# Patient Record
Sex: Female | Born: 1980 | Race: White | Hispanic: No | Marital: Married | Smoking: Never smoker
Health system: Southern US, Community
[De-identification: ages and names within clinical notes are randomized; demographics above are authoritative.]

## PROBLEM LIST (undated history)

## (undated) HISTORY — PX: ANTERIOR CRUCIATE LIGAMENT REPAIR: SHX115

## (undated) HISTORY — PX: TONSILLECTOMY: SUR1361

---

## 2011-08-29 ENCOUNTER — Ambulatory Visit: Payer: Self-pay | Admitting: Family Medicine

## 2011-09-22 ENCOUNTER — Ambulatory Visit: Payer: Self-pay | Admitting: Family Medicine

## 2011-10-04 ENCOUNTER — Ambulatory Visit: Payer: Self-pay | Admitting: Family Medicine

## 2011-10-12 ENCOUNTER — Ambulatory Visit (INDEPENDENT_AMBULATORY_CARE_PROVIDER_SITE_OTHER): Payer: BC Managed Care – PPO | Admitting: Family Medicine

## 2011-10-12 ENCOUNTER — Encounter: Payer: Self-pay | Admitting: Family Medicine

## 2011-10-12 DIAGNOSIS — Z136 Encounter for screening for cardiovascular disorders: Secondary | ICD-10-CM

## 2011-10-12 DIAGNOSIS — Z0000 Encounter for general adult medical examination without abnormal findings: Secondary | ICD-10-CM

## 2011-10-12 HISTORY — DX: Encounter for general adult medical examination without abnormal findings: Z00.00

## 2011-10-12 LAB — BASIC METABOLIC PANEL
BUN: 10 mg/dL (ref 6–23)
Calcium: 9.3 mg/dL (ref 8.4–10.5)
Chloride: 106 mEq/L (ref 96–112)
Creatinine, Ser: 0.9 mg/dL (ref 0.4–1.2)
GFR: 82.23 mL/min (ref >60.00)

## 2011-10-12 LAB — LIPID PANEL
Cholesterol: 156 mg/dL (ref 0–200)
LDL Cholesterol: 79 mg/dL (ref 0–99)
Triglycerides: 146 mg/dL (ref 0.0–149.0)

## 2011-10-12 NOTE — Progress Notes (Signed)
  Subjective:    Patient ID: Chelsea Frederick, female    DOB: 1981-01-20, 31 y.o.   MRN: [id number]  HPI    Review of Systems     Objective:   Physical Exam        Assessment & Plan:   Subjective:     Chelsea Frederick is a 31 y.o. female and is here to establish care.  The patient reports no problems.  History   Social History  . Marital Status: Married    Spouse Name: N/A    Number of Children: 1  . 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 Smoking status: Never Smoker    The PMH, PSH, Social History, Family History, Medications, and allergies have been reviewed in [facility], and have been updated if relevant.   Review of Systems Patient reports no  vision/ hearing changes,anorexia, weight change, fever ,adenopathy, persistant / recurrent hoarseness, swallowing issues, chest pain, edema,persistant / recurrent cough, hemoptysis, dyspnea(rest, exertional, paroxysmal nocturnal), gastrointestinal  bleeding (melena, rectal bleeding), abdominal pain, excessive heart burn, GU symptoms(dysuria, hematuria, pyuria, voiding/incontinence  Issues) syncope, focal weakness, severe memory loss, concerning skin lesions, depression, anxiety, abnormal bruising/bleeding, major joint swelling, breast masses or abnormal vaginal bleeding.     Objective:  BP 104/60  Pulse 68  Temp(Src) 98.3 F (36.8 C) (Oral)  Ht 5' 8.75" (1.746 m)  Wt 142 lb (64.411 kg)  BMI 21.12 kg/m2  General:  Well-developed,well-nourished,in no acute distress; alert,appropriate and cooperative throughout examination Head:  normocephalic and atraumatic.   Eyes:  vision grossly intact, pupils equal, pupils round, and pupils reactive to light.   Ears:  R ear normal and L ear normal.   Nose:  no external deformity.   Mouth:  good dentition.   Neck:  No deformities, masses, or tenderness noted. Lungs:  Normal respiratory effort, chest  expands symmetrically. Lungs are clear to auscultation, no crackles or wheezes. Heart:  Normal rate and regular rhythm. S1 and S2 normal without gallop, murmur, click, rub or other extra sounds. Abdomen:  Bowel sounds positive,abdomen soft and non-tender without masses, organomegaly or hernias noted. Msk:  No deformity or scoliosis noted of thoracic or lumbar spine.   Extremities:  No clubbing, cyanosis, edema, or deformity noted with normal full range of motion of all joints.   Neurologic:  alert & oriented X3 and gait normal.   Skin:  Intact without suspicious lesions or rashes Psych:  Cognition and judgment appear intact. Alert and cooperative with normal attention span and concentration. No apparent delusions, illusions, hallucinations   Assessment:    Healthy female exam.      Plan:  Reviewed preventive care protocols, scheduled due services, and updated immunizations Discussed nutrition, exercise, diet, and healthy lifestyle.    Orders Placed This Encounter  Procedures  . Basic Metabolic Panel  . Lipid Panel    See After Visit Summary for Counseling Recommendations

## 2011-10-12 NOTE — Patient Instructions (Signed)
Great to meet you. We will call you with your lab results. 

## 2011-11-06 ENCOUNTER — Ambulatory Visit (INDEPENDENT_AMBULATORY_CARE_PROVIDER_SITE_OTHER): Payer: BC Managed Care – PPO | Admitting: Family Medicine

## 2011-11-06 ENCOUNTER — Encounter: Payer: Self-pay | Admitting: Family Medicine

## 2011-11-06 VITALS — BP 102/60 | HR 60 | Temp 97.9°F | Wt 139.0 lb

## 2011-11-06 DIAGNOSIS — M25569 Pain in unspecified knee: Secondary | ICD-10-CM

## 2011-11-06 DIAGNOSIS — M25562 Pain in left knee: Secondary | ICD-10-CM

## 2011-11-06 NOTE — Patient Instructions (Signed)
Great to see you. Still go easy on knee. We will call you with your xray results tomorrow.

## 2011-11-06 NOTE — Progress Notes (Signed)
SUBJECTIVE: Chelsea Frederick is a 31 y.o. female who sustained a left knee injury 1 week(s) ago. Mechanism of injury: landing on ground after spiking a volleyball. Immediate symptoms: immediate pain, no deformity was noted by the patient. She was very nervous because 11 years ago, she tore her left ACL and had it repaired. Once she got home, she was stretching and felt a "pop" but no pain afterwards.  Knee has been intermittently mildly swollen at end of day and knee feels a little "full behind it" but not painful.  Patient Active Problem List  Diagnoses  . Routine general medical examination at a health care facility   No past medical history on file. Past Surgical History  Procedure Date  . Anterior cruciate ligament repair   . Tonsillectomy    History  Substance Use Topics  . Smoking status: Never Smoker   . Smokeless tobacco: Not on file  . Alcohol Use: Not on file   Family History  Problem Relation Age of Onset  . COPD Mother   . Heart disease Father 68    multiple MIs   No Known Allergies Current Outpatient Prescriptions on File Prior to Visit  Medication Sig Dispense Refill  . Multiple Vitamin (MULTIVITAMIN) tablet Take 1 tablet by mouth daily.      . Probiotic Product (PROBIOTIC FORMULA PO) Take by mouth daily.       The PMH, PSH, Social History, Family History, Medications, and allergies have been reviewed in [facility], and have been updated if relevant.   OBJECTIVE: BP 102/60  Pulse 60  Temp(Src) 97.9 F (36.6 C) (Oral)  Wt 139 lb (63.05 kg)  Vital signs as noted above. Appearance: alert, well appearing, and in no distress. Knee exam: normal exam, no swelling, tenderness, instability; ligaments intact, FROM small palpable baker's cyst X-ray: ordered, but results not yet available.  ASSESSMENT: Knee :baker's cyst  PLAN: rest the injured area as much as practical Xrays See orders for this visit as documented in the electronic medical record.

## 2011-11-07 ENCOUNTER — Telehealth: Payer: Self-pay | Admitting: Family Medicine

## 2011-11-07 ENCOUNTER — Ambulatory Visit (INDEPENDENT_AMBULATORY_CARE_PROVIDER_SITE_OTHER)
Admission: RE | Admit: 2011-11-07 | Discharge: 2011-11-07 | Disposition: A | Discharge status: Home / Self Care | Payer: BC Managed Care – PPO | Source: Ambulatory Visit | Attending: Family Medicine | Admitting: Family Medicine

## 2011-11-07 DIAGNOSIS — M25562 Pain in left knee: Secondary | ICD-10-CM

## 2011-11-07 DIAGNOSIS — M25569 Pain in unspecified knee: Secondary | ICD-10-CM

## 2011-11-07 NOTE — Telephone Encounter (Signed)
Please see result note 

## 2011-11-07 NOTE — Telephone Encounter (Signed)
Patient has been advised of results.  

## 2011-11-07 NOTE — Telephone Encounter (Signed)
Caller calling for test results.

## 2011-11-09 ENCOUNTER — Ambulatory Visit: Payer: BC Managed Care – PPO | Admitting: Family Medicine

## 2011-11-09 ENCOUNTER — Telehealth: Payer: Self-pay

## 2011-11-09 NOTE — Telephone Encounter (Signed)
Advised patient

## 2011-11-09 NOTE — Telephone Encounter (Signed)
I think it is ok to return to volleyball but listen to her body- take it slow. Ice knee after exercises and make sure she is stretching and cool down after exercise.

## 2011-11-09 NOTE — Telephone Encounter (Signed)
Left message asking pt to call back. 

## 2011-11-09 NOTE — Telephone Encounter (Signed)
Pt wants to know Dr Dellie Catholic advice on when OK to return to volleyball and begin exercising. Pain in knee is gone but still has feeling of fullness left knee. If pharmacy needed pt uses [facility]. Pt can be reached [phone].

## 2013-02-13 ENCOUNTER — Ambulatory Visit (INDEPENDENT_AMBULATORY_CARE_PROVIDER_SITE_OTHER): Payer: BC Managed Care – PPO | Admitting: Family Medicine

## 2013-02-13 ENCOUNTER — Encounter: Payer: Self-pay | Admitting: Family Medicine

## 2013-02-13 VITALS — BP 100/64 | HR 75 | Temp 98.2°F | Wt 136.5 lb

## 2013-02-13 DIAGNOSIS — R21 Rash and other nonspecific skin eruption: Secondary | ICD-10-CM

## 2013-02-13 NOTE — Assessment & Plan Note (Signed)
Highly likely not tick related.  Likely local irritation that is resolving from some other incidental insect bite/sting.  Would follow clinically. If other sx, then notify the clinic.  Murmur and nausea likely from pregnancy, as expected.  D/w pt.  She agrees.

## 2013-02-13 NOTE — Patient Instructions (Addendum)
I wouldn't do anything now other than monitor the rash.  It should slowly resolve.  If you have a fever, spreading rash, or other concerns, then let us know.

## 2013-02-13 NOTE — Progress Notes (Signed)
Pregnant, ~8 weeks.  Hasn't seen OB clinic yet.  No VB, no CTX, no LOF.    Tick bite earlier this summer.  She removed it, it wasn't engorged.  It was a few weeks ago, she wasn't sure about the site since the site was well healed.    Recently noted rash on back of L knee.  Itched a lot.  It is less red and less itchy today.  Central epithelial disruption.  No FCVD. She has morning sickness but this is typical for her, similar to prev.  No meds or tx yet for the area.    Meds, vitals, and allergies reviewed.   ROS: See HPI.  Otherwise, noncontributory.  nad ncat Mmm, no oral lesion Neck supple, no LA rrr Murmur noted as expected with pregnancy.  ctab abd soft, not ttp 2.5 x 2.5 cm faint pink macular rash on L popliteal area with central small epithelial disruption w/o FB noted.

## 2013-12-24 ENCOUNTER — Telehealth: Payer: Self-pay | Admitting: Family Medicine

## 2013-12-24 NOTE — Telephone Encounter (Signed)
Pt called and would like referral to orthopaedic for lower back pain.  Offered appointments since have not seen in over a year, pt declined, just wanted referral.

## 2013-12-24 NOTE — Telephone Encounter (Signed)
We cannot place this type of referral without seeing her first.  She could also see Dr. Patsy Lageropland here who is a wonderful sports medicine specialist- she would not have to see me first to see him.

## 2013-12-25 NOTE — Telephone Encounter (Signed)
Left message on phone that Dr. Dayton MartesAron recommends that she see Dr. Patsy Lageropland in our office for evaluation of back pain.

## 2013-12-26 NOTE — Telephone Encounter (Signed)
Mr Ealy said pt's pain level is 7 with back pain right now; Mr Minckler said he is not asking for referral but wants the name of a orthopedic back specialist that Dr Dayton Martes would recommend. Tried to explain that pt has not been seen by PCP in over one year and pt needs to be evaluated for an opinion of who pt should be referred to. Once again Mr Dobias said he does not want a referral he wants the name of a back specialist. Pt has been seeing a chiropractor. Mr Mcculley request cb ASAP.

## 2013-12-26 NOTE — Telephone Encounter (Signed)
Shirlee Limerick, do you know who may be a good "back specialist"

## 2013-12-26 NOTE — Telephone Encounter (Signed)
Dr Chelsea Frederick at [facility] is a back specialist. But any Ortho office would see her and do the workup.

## 2013-12-26 NOTE — Telephone Encounter (Signed)
See Marion's suggestion

## 2013-12-26 NOTE — Telephone Encounter (Signed)
Spoke to pts husband and advised per Shirlee Limerick. States that he will contact chiropractor, who recently treated pt to see if he can place referral. If not, pt will keep appt with SCopland 01/01/14.

## 2013-12-30 ENCOUNTER — Ambulatory Visit: Payer: BC Managed Care – PPO | Admitting: Family Medicine

## 2014-01-01 ENCOUNTER — Institutional Professional Consult (permissible substitution): Payer: BC Managed Care – PPO | Admitting: Family Medicine

## 2014-03-13 ENCOUNTER — Ambulatory Visit (INDEPENDENT_AMBULATORY_CARE_PROVIDER_SITE_OTHER): Payer: BC Managed Care – PPO | Admitting: Internal Medicine

## 2014-03-13 ENCOUNTER — Encounter: Payer: Self-pay | Admitting: Internal Medicine

## 2014-03-13 VITALS — BP 104/60 | HR 84 | Temp 99.8°F | Wt 138.8 lb

## 2014-03-13 DIAGNOSIS — R197 Diarrhea, unspecified: Secondary | ICD-10-CM

## 2014-03-13 DIAGNOSIS — R110 Nausea: Secondary | ICD-10-CM

## 2014-03-13 DIAGNOSIS — K921 Melena: Secondary | ICD-10-CM

## 2014-03-13 NOTE — Progress Notes (Signed)
Pre visit review using our clinic review tool, if applicable. No additional management support is needed unless otherwise documented below in the visit note. 

## 2014-03-13 NOTE — Progress Notes (Signed)
Subjective:    Patient ID: Chelsea Frederick, female    DOB: 07-21-1981, 33 y.o.   MRN: [id number]  HPI  Pt reports to the clinic today with c/o fever, chills, body aches, nausea and diarrhea. She reports this started last night.  She has noticed that there is blood in her diarrhea. She is able to keep fluids down. She does have some associated abdominal cramping. She has tried [employer], Tylenol and Imodium. She has not had sick contacts that she is aware of. She has not been on antibiotics recently. She has not traveled to a foreign country or drank contaminated water.  Review of Systems      No past medical history on file.  Current Outpatient Prescriptions  Medication Sig Dispense Refill  . Docosahexaenoic Acid (DHA PO) Take 1 tablet by mouth daily.      . Prenatal Vit-Fe Fumarate-FA (MULTIVITAMIN-PRENATAL) 27-0.8 MG TABS Take 1 tablet by mouth daily at 12 noon.       No current facility-administered medications for this visit.    No Known Allergies  Family History  Problem Relation Age of Onset  . COPD Mother   . Heart disease Father 44    multiple MIs    History   Social History  . Marital Status: Married    Spouse Name: N/A    Number of Children: 1  . 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 Smoking status: Never Smoker   . Smokeless tobacco: Not on file  . Alcohol Use: Not on file  . Drug Use: Not on file  . Sexual Activity: Not on file   Other Topics Concern  . Not on file   Social History Narrative  . No narrative on file     Constitutional: Pt reports fever. Denies malaise, fatigue, headache or abrupt weight changes.  Respiratory: Denies difficulty breathing, shortness of breath, cough or sputum production.   Cardiovascular: Denies chest pain, chest tightness, palpitations or swelling in the hands or feet.  Gastrointestinal: Pt reports nausea and  diarrhea, blood in stool. Denies abdominal pain, bloating, constipation.  GU: Denies urgency, frequency, pain with urination, burning sensation, blood in urine, odor or discharge.  No other specific complaints in a complete review of systems (except as listed in HPI above).  Objective:   Physical Exam   BP 104/60  Pulse 84  Temp(Src) 99.8 F (37.7 C) (Oral)  Wt 138 lb 12 oz (62.937 kg) Wt Readings from Last 3 Encounters:  03/13/14 138 lb 12 oz (62.937 kg)  02/13/13 136 lb 8 oz (61.916 kg)  11/06/11 139 lb (63.05 kg)    General: Appears her stated age, ill appearing in NAD. Skin: Warm, dry and intact.  Cardiovascular: Normal rate and rhythm. S1,S2 noted.  No murmur, rubs or gallops noted. No JVD or BLE edema. No carotid bruits noted. Pulmonary/Chest: Normal effort and positive vesicular breath sounds. No respiratory distress. No wheezes, rales or ronchi noted.  Abdomen: Soft and nontender. Normal bowel sounds, no bruits noted. No distention or masses noted. Liver, spleen and kidneys non palpable. Rectal: Normal anatomy. No external hemorrhoids. Good rectal tone. No internal hemorrhoids or fissure noted. + hemoccult.  BMET    Component Value Date/Time   NA 140 10/12/2011 1501   K 4.1 10/12/2011 1501   CL 106 10/12/2011 1501   CO2 27 10/12/2011 1501  GLUCOSE 84 10/12/2011 1501   BUN 10 10/12/2011 1501   CREATININE 0.9 10/12/2011 1501   CALCIUM 9.3 10/12/2011 1501    Lipid Panel     Component Value Date/Time   CHOL 156 10/12/2011 1501   TRIG 146.0 10/12/2011 1501   HDL 47.90 10/12/2011 1501   CHOLHDL 3 10/12/2011 1501   VLDL 29.2 10/12/2011 1501   LDLCALC 79 10/12/2011 1501          Assessment & Plan:   Viral diarrhea:  Continue the Imodium x 24 hours Drink plenty of fluids to avoid dehydration She declines RX for zofran Wash hands thoroughly after going to restroom  If worse over the weekend or more blood in stool, go to ER If not worse but persist, follow up with PCP on Monday

## 2014-03-13 NOTE — Patient Instructions (Addendum)

## 2014-03-16 ENCOUNTER — Encounter: Payer: Self-pay | Admitting: Internal Medicine

## 2014-03-16 ENCOUNTER — Ambulatory Visit (INDEPENDENT_AMBULATORY_CARE_PROVIDER_SITE_OTHER): Payer: BC Managed Care – PPO | Admitting: Internal Medicine

## 2014-03-16 VITALS — BP 94/62 | HR 91 | Temp 98.1°F | Wt 140.0 lb

## 2014-03-16 DIAGNOSIS — K921 Melena: Secondary | ICD-10-CM

## 2014-03-16 DIAGNOSIS — R197 Diarrhea, unspecified: Secondary | ICD-10-CM

## 2014-03-16 DIAGNOSIS — R110 Nausea: Secondary | ICD-10-CM

## 2014-03-16 LAB — COMPREHENSIVE METABOLIC PANEL
ALBUMIN: 3.7 g/dL (ref 3.5–5.2)
ALK PHOS: 55 U/L (ref 39–117)
ALT: 15 U/L (ref 0–35)
AST: 20 U/L (ref 0–37)
BUN: 9 mg/dL (ref 6–23)
CO2: 28 mEq/L (ref 19–32)
Calcium: 8.9 mg/dL (ref 8.4–10.5)
Chloride: 102 mEq/L (ref 96–112)
Creatinine, Ser: 0.9 mg/dL (ref 0.4–1.2)
GFR: 76.84 mL/min (ref >60.00)
GLUCOSE: 76 mg/dL (ref 70–99)
POTASSIUM: 3.8 meq/L (ref 3.5–5.1)
Sodium: 135 mEq/L (ref 135–145)
TOTAL PROTEIN: 6.8 g/dL (ref 6.0–8.3)
Total Bilirubin: 0.3 mg/dL (ref 0.2–1.2)

## 2014-03-16 LAB — CBC WITH DIFFERENTIAL/PLATELET
Basophils Absolute: 0 10*3/uL (ref 0.0–0.1)
Basophils Relative: 0.4 % (ref 0.0–3.0)
EOS PCT: 0.6 % (ref 0.0–5.0)
Eosinophils Absolute: 0 10*3/uL (ref 0.0–0.7)
HCT: 35.3 % — ABNORMAL LOW (ref 36.0–46.0)
Hemoglobin: 12.2 g/dL (ref 12.0–15.0)
Lymphocytes Relative: 34 % (ref 12.0–46.0)
Lymphs Abs: 1.6 10*3/uL (ref 0.7–4.0)
MCHC: 34.6 g/dL (ref 30.0–36.0)
MCV: 89.5 fl (ref 78.0–100.0)
MONO ABS: 0.8 10*3/uL (ref 0.1–1.0)
NEUTROS PCT: 46.7 % (ref 43.0–77.0)
Neutro Abs: 2.2 10*3/uL (ref 1.4–7.7)
PLATELETS: 146 10*3/uL — AB (ref 150.0–400.0)
RBC: 3.94 Mil/uL (ref 3.87–5.11)
RDW: 11.9 % (ref 11.5–15.5)
WBC: 4.6 10*3/uL (ref 4.0–10.5)

## 2014-03-16 NOTE — Progress Notes (Signed)
Subjective:    Patient ID: Chelsea Frederick, female    DOB: 07/27/1981, 33 y.o.   MRN: [id number]  HPI  Pt presents to the clinic today with c/o continued nausea and diarrhea. She reports there was no improvement with the Imodium. She is also starting to have sharp abdominal pains, fever and chills. She has been taking the tylenol and imodium for the fevers.  Review of Systems      No past medical history on file.  Current Outpatient Prescriptions  Medication Sig Dispense Refill  . Docosahexaenoic Acid (DHA PO) Take 1 tablet by mouth daily.      . Prenatal Vit-Fe Fumarate-FA (MULTIVITAMIN-PRENATAL) 27-0.8 MG TABS Take 1 tablet by mouth daily at 12 noon.       No current facility-administered medications for this visit.    No Known Allergies  Family History  Problem Relation Age of Onset  . COPD Mother   . Heart disease Father 78    multiple MIs    History   Social History  . Marital Status: Married    Spouse Name: N/A    Number of Children: 1  . 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 Smoking status: Never Smoker   . Smokeless tobacco: Not on file  . Alcohol Use: Not on file  . Drug Use: Not on file  . Sexual Activity: Not on file   Other Topics Concern  . Not on file   Social History Narrative  . No narrative on file     Constitutional: Denies fever, malaise, fatigue, headache or abrupt weight changes.  HEENT: Denies eye pain, eye redness, ear pain, ringing in the ears, wax buildup, runny nose, nasal congestion, bloody nose, or sore throat. Respiratory: Denies difficulty breathing, shortness of breath, cough or sputum production.   Cardiovascular: Denies chest pain, chest tightness, palpitations or swelling in the hands or feet.  Gastrointestinal: Pt reports nausea, diarrhea and blood in stool. Denies bloating, constipation.    No other specific complaints  in a complete review of systems (except as listed in HPI above).   Objective:   Physical Exam   BP 94/62  Pulse 91  Temp(Src) 98.1 F (36.7 C) (Oral)  Wt 140 lb (63.504 kg)  SpO2 98% Wt Readings from Last 3 Encounters:  03/16/14 140 lb (63.504 kg)  03/13/14 138 lb 12 oz (62.937 kg)  02/13/13 136 lb 8 oz (61.916 kg)    General: Appears her stated age, well developed, well nourished in NAD. Cardiovascular: Normal rate and rhythm. S1,S2 noted.  No murmur, rubs or gallops noted. No JVD or BLE edema. No carotid bruits noted. Pulmonary/Chest: Normal effort and positive vesicular breath sounds. No respiratory distress. No wheezes, rales or ronchi noted.  Abdomen: Soft and nontender. No pain at mcburney's point. Normal bowel sounds, no bruits noted. No distention or masses noted. Liver, spleen and kidneys non palpable. :  BMET    Component Value Date/Time   NA 140 10/12/2011 1501   K 4.1 10/12/2011 1501   CL 106 10/12/2011 1501   CO2 27 10/12/2011 1501   GLUCOSE 84 10/12/2011 1501   BUN 10 10/12/2011 1501   CREATININE 0.9 10/12/2011 1501   CALCIUM 9.3 10/12/2011 1501    Lipid Panel     Component Value Date/Time   CHOL 156 10/12/2011 1501   TRIG 146.0 10/12/2011 1501  HDL 47.90 10/12/2011 1501   CHOLHDL 3 10/12/2011 1501   VLDL 29.2 10/12/2011 1501   LDLCALC 79 10/12/2011 1501    CBC No results found for this basename: wbc, rbc, hgb, hct, plt, mcv, mch, mchc, rdw, neutrabs, lymphsabs, monoabs, eosabs, basosabs    Hgb A1C No results found for this basename: HGBA1C        Assessment & Plan:   Diarrhea, fever, chills, blood in stool:  Will check CBC and CMET May need CT Scan abd but will wait until labs are back Stool studies ordered  Will follow up after blood work is back

## 2014-03-16 NOTE — Progress Notes (Signed)
Pre visit review using our clinic review tool, if applicable. No additional management support is needed unless otherwise documented below in the visit note. 

## 2014-03-16 NOTE — Patient Instructions (Addendum)
Bloody Diarrhea °Bloody diarrhea can be caused by many different conditions. Most of the time bloody diarrhea is the result of food poisoning or minor infections. Bloody diarrhea usually improves over 2 to 3 days of rest and fluid replacement. Other conditions that can cause bloody diarrhea include: °· Internal bleeding. °· Infection. °· Diseases of the bowel and colon. °Internal bleeding from an ulcer or bowel disease can be severe and requires hospital care or even surgery. °DIAGNOSIS  °To find out what is wrong your caregiver may check your: °· Stool. °· Blood. °· Results from a test that looks inside the body (endoscopy). °TREATMENT  °· Get plenty of rest. °· Drink enough water and fluids to keep your urine clear or pale yellow. °· Do not smoke. °· Solid foods and dairy products should be avoided until your illness improves. °· As you improve, slowly return to a regular diet with easily-digested foods first. Examples are: °¨ Bananas. °¨ Rice. °¨ Toast. °¨ Crackers. °You should only need these for about 2 days before adding more normal foods to your diet. °· Avoid spicy or fatty foods as well as caffeine and alcohol for several days. °· Medicine to control cramping and diarrhea can relieve symptoms but may prolong some cases of bloody diarrhea. Antibiotics can speed recovery from diarrhea due to some bacterial infections. Call your caregiver if diarrhea does not get better in 3 days. °SEEK MEDICAL CARE IF:  °· You do not improve after 3 days. °· Your diarrhea improves but your stool appears black. °SEEK IMMEDIATE MEDICAL CARE IF:  °· You become extremely weak or faint. °· You become very sweaty. °· You have increased pain or bleeding. °· You develop repeated vomiting. °· You vomit and you see blood or the vomit looks black in color. °· You have a fever. °Document Released: 07/24/2005 Document Revised: 10/16/2011 Document Reviewed: 06/25/2009 °ExitCare® Patient Information ©2015 [employer]. This information is  not intended to replace advice given to you by your health care provider. Make sure you discuss any questions you have with your health care provider. ° °

## 2014-03-16 NOTE — Addendum Note (Signed)
Addended by: Liane ComberHAVERS, Sanchez Hemmer C on: 03/16/2014 11:53 AM   Modules accepted: Orders

## 2014-03-17 ENCOUNTER — Other Ambulatory Visit: Payer: Self-pay | Admitting: Internal Medicine

## 2014-03-17 DIAGNOSIS — R509 Fever, unspecified: Secondary | ICD-10-CM

## 2014-03-17 DIAGNOSIS — R109 Unspecified abdominal pain: Secondary | ICD-10-CM

## 2014-03-17 DIAGNOSIS — R197 Diarrhea, unspecified: Secondary | ICD-10-CM

## 2014-03-18 LAB — C. DIFFICILE GDH AND TOXIN A/B
C. DIFF TOXIN A/B: NOT DETECTED
C. difficile GDH: NOT DETECTED

## 2014-03-19 LAB — OVA AND PARASITE EXAMINATION: OP: NONE SEEN

## 2014-03-23 ENCOUNTER — Telehealth: Payer: Self-pay | Admitting: *Deleted

## 2014-03-23 NOTE — Telephone Encounter (Signed)
Lab called with stool culture results.  Salmonella was detected. Results not released in Epic yet, but will be soon.

## 2014-03-23 NOTE — Telephone Encounter (Signed)
Pt notified, no longer having symptoms.

## 2014-06-08 ENCOUNTER — Encounter: Payer: Self-pay | Admitting: Internal Medicine

## 2014-06-18 LAB — STOOL CULTURE

## 2014-12-22 ENCOUNTER — Ambulatory Visit (INDEPENDENT_AMBULATORY_CARE_PROVIDER_SITE_OTHER): Payer: BLUE CROSS/BLUE SHIELD | Admitting: Family Medicine

## 2014-12-22 ENCOUNTER — Ambulatory Visit (INDEPENDENT_AMBULATORY_CARE_PROVIDER_SITE_OTHER)
Admission: RE | Admit: 2014-12-22 | Discharge: 2014-12-22 | Disposition: A | Discharge status: Home / Self Care | Payer: BLUE CROSS/BLUE SHIELD | Source: Ambulatory Visit | Attending: Family Medicine | Admitting: Family Medicine

## 2014-12-22 ENCOUNTER — Encounter: Payer: Self-pay | Admitting: Family Medicine

## 2014-12-22 ENCOUNTER — Telehealth: Payer: Self-pay | Admitting: Family Medicine

## 2014-12-22 VITALS — BP 114/62 | HR 63 | Temp 98.1°F | Wt 132.2 lb

## 2014-12-22 DIAGNOSIS — R42 Dizziness and giddiness: Secondary | ICD-10-CM

## 2014-12-22 DIAGNOSIS — M542 Cervicalgia: Secondary | ICD-10-CM | POA: Diagnosis not present

## 2014-12-22 MED ORDER — CYCLOBENZAPRINE HCL 5 MG PO TABS: 5.0000 mg | ORAL_TABLET | Freq: Three times a day (TID) | ORAL | Status: DC | PRN

## 2014-12-22 MED ORDER — DEXAMETHASONE SODIUM PHOSPHATE 10 MG/ML IJ SOLN
10.0000 mg | Freq: Once | INTRAMUSCULAR | Status: AC
  Administered 2014-12-22: 10 mg via INTRAMUSCULAR

## 2014-12-22 NOTE — Patient Instructions (Signed)
Please keep me updated and if anything worsens, go immediately to the ER.

## 2014-12-22 NOTE — Telephone Encounter (Signed)
She stated her xray was normal What should she do next she is taking muscles relaxer Should she put cold or hot compress on it Should she stay still or move around more She been in bed most of day How long should she wait out the pain she is going out of town on Thursday   Pt would like a call back today

## 2014-12-22 NOTE — Progress Notes (Signed)
Pre visit review using our clinic review tool, if applicable. No additional management support is needed unless otherwise documented below in the visit note. 

## 2014-12-22 NOTE — Assessment & Plan Note (Signed)
New- complicated presentation but as I explained to pt and her husband, there are reassuring signs on exam and in history. She is not experiencing any upper extremity weakness or radiculopathy and has normal neuro exam.  Also less likely to be cardiac since she is experiencing limited range of motion of her neck.  ? Muscle spasm with nerve impingement and acute onset of severe pain caused nausea and syncope.  Discussed going to the ER versus starting work up here and if symptoms worsen, go to the ER. Pt and husband want to start work up here since symptoms are improving somewhat.  IM decadron given in office to reduce inflammation, eRx sent for flexeril.  Stat cervical xray, may need cervical MRI depending on progression of symptoms.  She will keep me closely updated.  The patient indicates understanding of these issues and agrees with the plan.

## 2014-12-22 NOTE — Progress Notes (Signed)
Subjective:   Patient ID: Chelsea Frederick, female    DOB: 1980/12/22, 34 y.o.   MRN: [id number]  Chelsea Frederick is a pleasant 34 y.o. year old female who presents to clinic today with Neck Pain; Nausea; and Headache  on 12/22/2014  HPI: Acute onset of neck pain at 2 am.  Was lifting her head in the middle of the night and immediately felt something in her neck "crack."  Immediate limited range of motion in her neck and because nauseated and then passed out.  Her husband caught her- she was out for "maybe a few seconds."  Still nauseated but has not vomited.  Neck pain is improving but still has limited ROM.  Husband thinks this is improving as well.  Now has a low grade headache but did not have one last night.  Has never had anything like this before but has vomited and passed out with other types of pain in past.  No other recent injuries.  No fevers, blurred vision or photophobia.  No current outpatient prescriptions on file prior to visit.   No current facility-administered medications on file prior to visit.    No Known Allergies  No past medical history on file.  Past Surgical History  Procedure Laterality Date  . Anterior cruciate ligament repair    . Tonsillectomy      Family History  Problem Relation Age of Onset  . COPD Mother   . Heart disease Father [id number]    multiple MIs    History   Social History  . Marital Status: Married    Spouse Name: N/A  . Number of Children: 1  . 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 Smoking status: Never Smoker   . Smokeless tobacco: Not on file  . Alcohol Use: Not on file  . Drug Use: Not on file  . Sexual Activity: Not on file   Other Topics Concern  . Not on file   Social History Narrative   The PMH, PSH, Social History, Family History, Medications, and allergies [facility], and have been updated if  relevant.   Review of Systems  Constitutional: Negative.   Eyes: Negative.   Respiratory: Negative.   Cardiovascular: Negative.   Gastrointestinal: Positive for nausea. Negative for vomiting.  Endocrine: Negative.   Musculoskeletal: Positive for neck pain and neck stiffness. Negative for myalgias.  Skin: Negative.   Allergic/Immunologic: Negative.   Neurological: Positive for dizziness and headaches. Negative for tremors, seizures, syncope, facial asymmetry, speech difficulty, weakness, light-headedness and numbness.  Psychiatric/Behavioral: Negative.   All other systems reviewed and are negative.      Objective:    BP 114/62 mmHg  Pulse 63  Temp(Src) 98.1 F (36.7 C) (Oral)  Wt 132 lb 4 oz (59.988 kg)  SpO2 98%  LMP 12/12/2014   Physical Exam  Constitutional: She is oriented to person, place, and time. She appears well-developed and well-nourished. No distress.  HENT:  Head: Normocephalic.  Eyes: Conjunctivae are normal.  Neck: Muscular tenderness present. Carotid bruit is not present. No Brudzinski's sign and no Kernig's sign noted.  Limited ROM of neck,- pain with extension and flexion, does seem worse with extension. Also limited ROM with head turning from side to side  Cardiovascular: Normal rate.   Pulmonary/Chest: Effort normal.  Neurological: She is alert and oriented to person, place, and  time. She has normal reflexes. She displays normal reflexes. No cranial nerve deficit. Coordination normal.  Normal grip strength bilaterally  Skin: Skin is warm and dry.  Psychiatric: She has a normal mood and affect. Her behavior is normal. Judgment and thought content normal.  Nursing note and vitals reviewed.         Assessment & Plan:   Neck pain, acute - Plan: DG Cervical Spine Complete, CANCELED: DG Cervical Spine Complete  Dizziness and giddiness No Follow-up on file.

## 2014-12-23 NOTE — Telephone Encounter (Signed)
Called pt and spoke with her this morning. She is feeling much better- almost has full range of motion of her neck. Actually plans on going to work today and she will keep me updated.

## 2017-05-21 ENCOUNTER — Encounter: Payer: Self-pay | Admitting: Family Medicine

## 2017-05-21 ENCOUNTER — Ambulatory Visit (INDEPENDENT_AMBULATORY_CARE_PROVIDER_SITE_OTHER)
Admission: RE | Admit: 2017-05-21 | Discharge: 2017-05-21 | Disposition: A | Discharge status: Home / Self Care | Payer: BLUE CROSS/BLUE SHIELD | Source: Ambulatory Visit | Attending: Family Medicine | Admitting: Family Medicine

## 2017-05-21 ENCOUNTER — Ambulatory Visit (INDEPENDENT_AMBULATORY_CARE_PROVIDER_SITE_OTHER): Payer: BLUE CROSS/BLUE SHIELD | Admitting: Family Medicine

## 2017-05-21 VITALS — BP 106/68 | HR 68 | Temp 98.3°F | Ht 68.75 in | Wt 141.0 lb

## 2017-05-21 DIAGNOSIS — Z9889 Other specified postprocedural states: Secondary | ICD-10-CM

## 2017-05-21 DIAGNOSIS — M25562 Pain in left knee: Secondary | ICD-10-CM

## 2017-05-21 NOTE — Progress Notes (Signed)
SUBJECTIVE: Chelsea Frederick is a 36 y.o. female who sustained a left knee injury 1 1/2  week(s) ago. Mechanism of injury: unknown but started exercising one month ago. Immediate symptoms: immediate pain, delayed pain. Symptoms have been intermittent since that time. Prior history of related problems: no prior problems with this area in the past, previous surgery of ACL.  No current outpatient prescriptions on file prior to visit.   No current facility-administered medications on file prior to visit.     No Known Allergies  No past medical history on file.  Past Surgical History:  Procedure Laterality Date  . ANTERIOR CRUCIATE LIGAMENT REPAIR    . TONSILLECTOMY      Family History  Problem Relation Age of Onset  . COPD Mother   . Heart disease Father 91       multiple MIs    Social History   Social History  . Marital status: Married    Spouse name: N/A  . Number of children: 1  . Years of education: N/A   Occupational History  . 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 Smoking status: Never Smoker  . Smokeless tobacco: Not on file  . Alcohol use Not on file  . Drug use: Unknown  . Sexual activity: Not on file   Other Topics Concern  . Not on file   Social History Narrative  . No narrative on file   The PMH, PSH, Social History, Family History, Medications, and allergies have been reviewed in [facility], and have been updated if relevant.  OBJECTIVE:  BP 106/68 (BP Location: Left Arm, Patient Position: Sitting, Cuff Size: Normal)   Pulse 68   Temp 98.3 F (36.8 C) (Oral)   Ht 5' 8.75" (1.746 m)   Wt 141 lb (64 kg)   LMP 05/01/2017   SpO2 98%   BMI 20.97 kg/m   Vital signs as noted above. Appearance: alert, well appearing, and in no distress and oriented to person, place, and time. Knee exam: normal exam, no swelling, tenderness, instability; ligaments intact, FROM. X-ray: ordered, but results not  yet available.  ASSESSMENT: Knee internal derangement  PLAN: rest the injured area as much as practical, X-Ray ordered, referral to Orthopedics for this injury See orders for this visit as documented in the electronic medical record.

## 2017-05-23 NOTE — Addendum Note (Signed)
Addended by: Dianne DunARON, Jennaya Pogue M on: 05/23/2017 08:54 AM   Modules accepted: Orders

## 2017-05-24 ENCOUNTER — Encounter: Payer: Self-pay | Admitting: Family Medicine

## 2017-05-30 ENCOUNTER — Telehealth: Payer: Self-pay

## 2017-05-30 NOTE — Telephone Encounter (Signed)
I'm so sorry.  I thought I responded.  I do think she should see an orthopedist- the xray showed probable arthritis/changes due to surgery.  If she is still having symptoms, I do think an orthopedist will want to do an MRI and look at treatment options.

## 2017-05-30 NOTE — Telephone Encounter (Signed)
TA-Plz see pt email requesting for you to elaborate on X-Ray results/plz advise/thx dmf

## 2017-05-30 NOTE — Telephone Encounter (Signed)
Copied from CRM #[id number]. Topic: Quick Communication - See Telephone Encounter >> May 30, 2017 10:18 AM Guinevere FerrariMorris, Lukas Pelcher E, NT wrote: CRM for notification. See Telephone encounter for:  05/30/17. Pt. Called in and wanted someone to call her back about a recent X Ray.

## 2017-05-31 NOTE — Telephone Encounter (Signed)
Pt aware/she will call her ortho and sched appt/will advise if needs reports for them/thx dmf

## 2017-06-26 ENCOUNTER — Ambulatory Visit: Payer: BLUE CROSS/BLUE SHIELD | Admitting: Family Medicine

## 2017-06-26 ENCOUNTER — Encounter: Payer: Self-pay | Admitting: Family Medicine

## 2017-06-26 DIAGNOSIS — M25562 Pain in left knee: Secondary | ICD-10-CM | POA: Diagnosis not present

## 2017-06-26 NOTE — Patient Instructions (Signed)
Your pain is due to mild arthritis. These are the different medications you can take for this: Tylenol 500mg  1-2 tabs three times a day for pain. Capsaicin, aspercreme, or biofreeze topically up to four times a day may also help with pain. Some supplements that may help for arthritis: Boswellia extract, curcumin, pycnogenol Aleve 1-2 tabs twice a day with food as needed Cortisone injections are an option. If cortisone injections do not help, there are different types of shots that may help but they take longer to take effect (viscosupplementation). It's important that you continue to stay active. Straight leg raises, knee extensions 3 sets of 10 once a day (add ankle weight if these become too easy). Consider physical therapy to strengthen muscles around the joint that hurts to take pressure off of the joint itself - let me know if you want to do this. Shoe inserts with good arch support may be helpful. Heat or ice 15 minutes at a time 3-4 times a day as needed to help with pain. Water aerobics and cycling with low resistance are the best two types of exercise for arthritis though any exercise is ok as long as it doesn't worsen the pain. Follow up with me in 6 weeks or as needed.  I would consider an MRI of this knee if the catching, giving out becomes more frequent to characterize the possible loose body in your knee.

## 2017-06-30 ENCOUNTER — Encounter: Payer: Self-pay | Admitting: Family Medicine

## 2017-06-30 NOTE — Progress Notes (Signed)
PCP: Dianne DunAron, Talia M, MD  Subjective:   HPI: Patient is a 36 y.o. female here for left knee pain.  Patient reports for about 1 1/2 months she's had worsening left knee pain medially. Associated swelling and pain with any exercise. Tried sleeve, brace for support. Pain level currently 1/10 and a soreness. History of ACL tear with reconstruction back in 2002 with a hamstring graft. Did well after this surgery and returned to volleyball. Reports also having intermittent episodes of this knee giving out since 2012 - if she turns wrong feels something pop back into place when she extends the knee. Tylenol has helped. No skin changes, numbness, new acute injuries.  History reviewed. No pertinent past medical history.  No current outpatient medications on file prior to visit.   No current facility-administered medications on file prior to visit.     Past Surgical History:  Procedure Laterality Date  . ANTERIOR CRUCIATE LIGAMENT REPAIR    . TONSILLECTOMY      No Known Allergies  Social History   Socioeconomic History  . Marital status: Married    Spouse name: Not on file  . Number of children: 1  . Years of education: Not on file  . Highest education level: Not on file  Social Needs  . Financial resource strain: Not on file  . Food insecurity - worry: Not on file  . Food insecurity - inability: Not on file  . Transportation needs - medical: Not on file  . Transportation needs - non-medical: Not on file  Occupational History  . Occupation: Professor    Employer: [employer]    Comment: biochem- [employer]  Tobacco Use  . Smoking status: Never Smoker  . Smokeless tobacco: Never Used  Substance and Sexual Activity  . Alcohol use: No  . Drug use: No  . Sexual activity: Not on file  Other Topics Concern  . Not on file  Social History Narrative  . Not on file    Family History  Problem Relation Age of Onset  . COPD Mother   . Heart disease Father  3450       multiple MIs    BP 110/82   Pulse 70   Ht 5\' 9"  (1.753 m)   Wt 137 lb (62.1 kg)   BMI 20.23 kg/m   Review of Systems: See HPI above.     Objective:  Physical Exam:  Gen: NAD, comfortable in exam room  Left knee: No gross deformity, ecchymoses.  Mild effusion TTP medial joint line.  No other tenderness. FROM with 5/5 strength. Negative ant/post drawers. Negative valgus/varus testing. Negative lachmanns. Negative mcmurrays, apleys, patellar apprehension. NV intact distally.  Right knee: No gross deformity, ecchymoses, swelling. No TTP. FROM with full strength. Negative ant/post drawers. Negative valgus/varus testing. Negative lachmanns. NV intact distally.   Assessment & Plan:  1. Left knee pain - independently reviewed radiographs and no acute abnormalities.  She does have apparent loose body that is well corticated suggesting this is old.  Discussed with her I believe she has two separate problems.  Her current pain is consistent with arthritis (radiographs not weight bearing, has medial tenderness with normal meniscus testing, and is 16 years out from ACL tear when arthritis is common 10+ years out).  Second issue of the catching is likely from this loose body.  We discussed MRI to further confirm location and that this is intraarticular and not calcified portion of meniscus (unlikely as on lateral it's fairly superior  in the joint).  She would like to wait on MRI and treat the arthritis.  Discussed tylenol, topical medications, supplements, aleve.  Shown home exercises to do daily.  Consider cortisone injection, physical therapy (she would like to wait on either of these today).  Heat or ice.  F/u in 6 weeks.

## 2017-06-30 NOTE — Assessment & Plan Note (Signed)
independently reviewed radiographs and no acute abnormalities.  She does have apparent loose body that is well corticated suggesting this is old.  Discussed with her I believe she has two separate problems.  Her current pain is consistent with arthritis (radiographs not weight bearing, has medial tenderness with normal meniscus testing, and is 16 years out from ACL tear when arthritis is common 10+ years out).  Second issue of the catching is likely from this loose body.  We discussed MRI to further confirm location and that this is intraarticular and not calcified portion of meniscus (unlikely as on lateral it's fairly superior in the joint).  She would like to wait on MRI and treat the arthritis.  Discussed tylenol, topical medications, supplements, aleve.  Shown home exercises to do daily.  Consider cortisone injection, physical therapy (she would like to wait on either of these today).  Heat or ice.  F/u in 6 weeks.

## 2017-12-27 ENCOUNTER — Encounter: Payer: Self-pay | Admitting: Internal Medicine

## 2017-12-27 ENCOUNTER — Ambulatory Visit: Payer: BLUE CROSS/BLUE SHIELD | Admitting: Internal Medicine

## 2017-12-27 VITALS — BP 106/74 | HR 72 | Temp 97.9°F | Wt 142.0 lb

## 2017-12-27 DIAGNOSIS — J301 Allergic rhinitis due to pollen: Secondary | ICD-10-CM | POA: Diagnosis not present

## 2017-12-27 DIAGNOSIS — Z0184 Encounter for antibody response examination: Secondary | ICD-10-CM | POA: Diagnosis not present

## 2017-12-27 MED ORDER — PREDNISONE 10 MG PO TABS: ORAL_TABLET | ORAL | 0 refills | Status: DC

## 2017-12-27 NOTE — Progress Notes (Signed)
HPI  Pt presents to the clinic today with c/o headache, facial pain and pressure, ear pain, nasal congestion, sore throat and cough. She reports this started 6 weeks ago. The headache is located . She describes the ear pain as. The right seems worse than the lest. She denies decreased hearing or drainage from the ear. She is not blowing anything out of her nose. She denies difficulty swallowing. The cough is non productive. She denies fever, chills or body aches. She has tried Xyzal and Ibuprofen with some relief. She has no history of allergies. She has not had sick contacts.  She would also like a MMR titer drawn to check immunity status. She did have the vaccines as a child.  Review of Systems    No past medical history on file.  Family History  Problem Relation Age of Onset  . COPD Mother   . Heart disease Father 33       multiple MIs    Social History   Socioeconomic History  . Marital status: Married    Spouse name: Not on file  . Number of children: 1  . Years of education: Not on file  . Highest education level: Not on file  Occupational History  . Occupation: Professor    Fish farm manager: [employer]    Comment: biochem- [employer]  Social Needs  . Financial resource strain: Not on file  . Food insecurity:    Worry: Not on file    Inability: Not on file  . Transportation needs:    Medical: Not on file    Non-medical: Not on file  Tobacco Use  . Smoking status: Never Smoker  . Smokeless tobacco: Never Used  Substance and Sexual Activity  . Alcohol use: No  . Drug use: No  . Sexual activity: Not on file  Lifestyle  . Physical activity:    Days per week: Not on file    Minutes per session: Not on file  . Stress: Not on file  Relationships  . Social connections:    Talks on phone: Not on file    Gets together: Not on file    Attends religious service: Not on file    Active member of club or organization: Not on file    Attends meetings of clubs  or organizations: Not on file    Relationship status: Not on file  . Intimate partner violence:    Fear of current or ex partner: Not on file    Emotionally abused: Not on file    Physically abused: Not on file    Forced sexual activity: Not on file  Other Topics Concern  . Not on file  Social History Narrative  . Not on file    No Known Allergies   Constitutional: Positive headache. Denies fatigue, fever or abrupt weight changes.  HEENT:  Positive facial pain, ear pain, nasal congestion and sore throat. Denies eye redness, ringing in the ears, wax buildup, runny nose or bloody nose. Respiratory: Positive cough. Denies difficulty breathing or shortness of breath.  Cardiovascular: Denies chest pain, chest tightness, palpitations or swelling in the hands or feet.   No other specific complaints in a complete review of systems (except as listed in HPI above).  Objective:  BP 106/74   Pulse 72   Temp 97.9 F (36.6 C) (Oral)   Wt 142 lb (64.4 kg)   LMP 12/01/2017   SpO2 99%   BMI 20.97 kg/m   General: Appears her stated  age, well developed, well nourished in NAD. HEENT: Head: normal shape and size, no sinus tenderness noted; Ears: Tm's gray and intact, normal light reflex; Nose: mucosa pink and moist, septum midline; Throat/Mouth: + PND. Teeth present, mucosa erythematous and moist, no exudate noted, no lesions or ulcerations noted.  Neck:  Bilateral anterior cervical adenopathy noted.  Cardiovascular: Normal rate and rhythm. S1,S2 noted.  No murmur, rubs or gallops noted.  Pulmonary/Chest: Normal effort and positive vesicular breath sounds. No respiratory distress. No wheezes, rales or ronchi noted.       Assessment & Plan:   Allergic Rhinitis:  Can use a Neti Pot which can be purchased from your local drug store. Continue Xyzal Start Nasonex once done with Pred Taper eRx for Pred Taper x 6 days  Immunity status to MMR unknown:  MMR titer today  RTC as needed or if  symptoms persist. Webb Silversmith, NP

## 2017-12-27 NOTE — Patient Instructions (Signed)

## 2017-12-28 LAB — MEASLES/MUMPS/RUBELLA IMMUNITY
RUBELLA: 16.5 {index}
Rubeola IgG: <25 AU/mL — ABNORMAL LOW

## 2018-01-03 ENCOUNTER — Ambulatory Visit (INDEPENDENT_AMBULATORY_CARE_PROVIDER_SITE_OTHER): Payer: BLUE CROSS/BLUE SHIELD

## 2018-01-03 DIAGNOSIS — Z23 Encounter for immunization: Secondary | ICD-10-CM | POA: Diagnosis not present

## 2018-01-07 ENCOUNTER — Ambulatory Visit: Payer: Self-pay

## 2018-01-07 NOTE — Telephone Encounter (Signed)
Pt c/o sore throat, headache and intermittent ear ache. Pt was seen in office for similar sx and finished a steroid taper. 2 days after finishing the taper, she has redeveloped sx. Pt has been taking acetaminophen or ibuprofen for sore throat pain. Care advice given and [facility]   Reason for Disposition . Earache also present    Intermittent ear pain  Answer Assessment - Initial Assessment Questions 1. ONSET: "When did the throat start hurting?" (Hours or days ago)      01/03/18 2. SEVERITY: "How bad is the sore throat?" (Scale 1-10; mild, moderate or severe)   - MILD (1-3):  doesn't interfere with eating or normal activities   - MODERATE (4-7): interferes with eating some solids and normal activities   - SEVERE (8-10):  excruciating pain, interferes with most normal activities   - SEVERE DYSPHAGIA: can't swallow liquids, drooling     Moderate 5/10 3. STREP EXPOSURE: "Has there been any exposure to strep within the past week?" If so, ask: "What type of contact occurred?"      no 4.  VIRAL SYMPTOMS: "Are there any symptoms of a cold, such as a runny nose, cough, hoarse voice or red eyes?"      no 5. FEVER: "Do you have a fever?" If so, ask: "What is your temperature, how was it measured, and when did it start?"     No but feels feverish when the pain reliever wears starts sweats and chills  6. PUS ON THE TONSILS: "Is there pus on the tonsils in the back of your throat?"     no 7. OTHER SYMPTOMS: "Do you have any other symptoms?" (e.g., difficulty breathing, headache, rash)     Headache-  8. PREGNANCY: "Is there any chance you are pregnant?" "When was your last menstrual period?"     No LMP: 1 week ago  Protocols used: SORE THROAT-A-AH

## 2018-01-08 ENCOUNTER — Encounter: Payer: Self-pay | Admitting: Internal Medicine

## 2018-01-08 ENCOUNTER — Ambulatory Visit: Payer: No Typology Code available for payment source | Admitting: Internal Medicine

## 2018-01-08 VITALS — BP 108/70 | HR 91 | Temp 98.2°F | Wt 139.0 lb

## 2018-01-08 DIAGNOSIS — J069 Acute upper respiratory infection, unspecified: Secondary | ICD-10-CM | POA: Diagnosis not present

## 2018-01-08 DIAGNOSIS — J302 Other seasonal allergic rhinitis: Secondary | ICD-10-CM | POA: Diagnosis not present

## 2018-01-08 MED ORDER — AMOXICILLIN 875 MG PO TABS: 875.0000 mg | ORAL_TABLET | Freq: Two times a day (BID) | ORAL | 0 refills | Status: DC

## 2018-01-08 NOTE — Patient Instructions (Signed)
Upper Respiratory Infection, Adult Most upper respiratory infections (URIs) are caused by a virus. A URI affects the nose, throat, and upper air passages. The most common type of URI is often called "the common cold." Follow these instructions at home:  Take medicines only as told by your doctor.  Gargle warm saltwater or take cough drops to comfort your throat as told by your doctor.  Use a warm mist humidifier or inhale steam from a shower to increase air moisture. This may make it easier to breathe.  Drink enough fluid to keep your pee (urine) clear or pale yellow.  Eat soups and other clear broths.  Have a healthy diet.  Rest as needed.  Go back to work when your fever is gone or your doctor says it is okay. ? You may need to stay home longer to avoid giving your URI to others. ? You can also wear a face mask and wash your hands often to prevent spread of the virus.  Use your inhaler more if you have asthma.  Do not use any tobacco products, including cigarettes, chewing tobacco, or electronic cigarettes. If you need help quitting, ask your doctor. Contact a doctor if:  You are getting worse, not better.  Your symptoms are not helped by medicine.  You have chills.  You are getting more short of breath.  You have brown or red mucus.  You have yellow or brown discharge from your nose.  You have pain in your face, especially when you bend forward.  You have a fever.  You have puffy (swollen) neck glands.  You have pain while swallowing.  You have white areas in the back of your throat. Get help right away if:  You have very bad or constant: ? Headache. ? Ear pain. ? Pain in your forehead, behind your eyes, and over your cheekbones (sinus pain). ? Chest pain.  You have long-lasting (chronic) lung disease and any of the following: ? Wheezing. ? Long-lasting cough. ? Coughing up blood. ? A change in your usual mucus.  You have a stiff neck.  You have  changes in your: ? Vision. ? Hearing. ? Thinking. ? Mood. This information is not intended to replace advice given to you by your health care provider. Make sure you discuss any questions you have with your health care provider. Document Released: 01/10/2008 Document Revised: 03/26/2016 Document Reviewed: 10/29/2013 Elsevier Interactive Patient Education  2018 [employer]  

## 2018-01-08 NOTE — Progress Notes (Signed)
Subjective:    Patient ID: Chelsea Frederick, female    DOB: [birth year], 37 y.o.   MRN: [id number]  HPI  Pt presents to the clinic today with c/o headache, ear fullness, runny nose and sore throat. She reports this started 2 weeks ago. She was seen 5/23 for the same, diagnosed with allergies. She was treated with Prednisone Taper x 6 days. She reports she felt great while on Prednisone, but as soon as she finished it, symptoms returned. She describes the headache as pressure behind her eyes. She denies visual changes or dizziness. She is blowing clear mucous out of her nose. She denies difficulty swallowing. She denies fever, but reports she has felt warm. She ran out of Xyzal but did start taking Nasocort 4 days ago. She has not tried anything additional OTC.She has not had sick contacts.  Review of Systems  No past medical history on file.  Current Outpatient Medications  Medication Sig Dispense Refill  . ibuprofen (ADVIL,MOTRIN) 200 MG tablet Take 400 mg by mouth every 6 (six) hours as needed.    . triamcinolone (NASACORT) 55 MCG/ACT AERO nasal inhaler Place into the nose.    Marland Kitchen amoxicillin (AMOXIL) 875 MG tablet Take 1 tablet (875 mg total) by mouth 2 (two) times daily. 20 tablet 0  . levocetirizine (XYZAL) 2.5 MG/5ML solution Take 2.5 mg by mouth every evening.     No current facility-administered medications for this visit.     No Known Allergies  Family History  Problem Relation Age of Onset  . COPD Mother   . Heart disease Father 69       multiple MIs    Social History   Socioeconomic History  . Marital status: Married    Spouse name: Not on file  . Number of children: 1  . Years of education: Not on file  . Highest education level: Not on file  Occupational History  . 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 Financial resource strain: Not on file  . Food insecurity:    Worry: Not on  file    Inability: Not on file  . Transportation needs:    Medical: Not on file    Non-medical: Not on file  Tobacco Use  . Smoking status: Never Smoker  . Smokeless tobacco: Never Used  Substance and Sexual Activity  . Alcohol use: No  . Drug use: No  . Sexual activity: Not on file  Lifestyle  . Physical activity:    Days per week: Not on file    Minutes per session: Not on file  . Stress: Not on file  Relationships  . Social connections:    Talks on phone: Not on file    Gets together: Not on file    Attends religious service: Not on file    Active member of club or organization: Not on file    Attends meetings of clubs or organizations: Not on file    Relationship status: Not on file  . Intimate partner violence:    Fear of current or ex partner: Not on file    Emotionally abused: Not on file    Physically abused: Not on file    Forced sexual activity: Not on file  Other Topics Concern  . Not on file  Social History Narrative  . Not on file     Constitutional: Pt reports headache. Denies fever, malaise, fatigue, or  abrupt weight changes.  HEENT: Pt reports runny nose, ear fullness, sore throat. Denies eye pain, eye redness, ear pain, ringing in the ears, wax buildup, nasal congestion, bloody nose Respiratory: Denies difficulty breathing, shortness of breath, cough or sputum production.   Cardiovascular: Denies chest pain, chest tightness, palpitations or swelling in the hands or feet.    No other specific complaints in a complete review of systems (except as listed in HPI above).     Objective:   Physical Exam   BP 108/70   Pulse 91   Temp 98.2 F (36.8 C) (Oral)   Wt 139 lb (63 kg)   LMP 12/31/2017   SpO2 98%   BMI 20.53 kg/m  Wt Readings from Last 3 Encounters:  01/08/18 139 lb (63 kg)  12/27/17 142 lb (64.4 kg)  06/26/17 137 lb (62.1 kg)    General: Appears her stated age, in NAD. HEENT: Head: normal shape and size, no sinus tenderness noted;   Ears: Tm's gray and intact, normal light reflex; Nose: mucosa pink and moist, septum midline; Throat/Mouth: Teeth present, mucosa pink and moist, +PND,  no exudate, lesions or ulcerations noted.  Neck:   Bilateral anterior cervical adenopathy noted. Cardiovascular: Normal rate and rhythm.  Pulmonary/Chest: Normal effort and positive vesicular breath sounds. No respiratory distress. No wheezes, rales or ronchi noted.   BMET    Component Value Date/Time   NA 135 03/16/2014 1152   K 3.8 03/16/2014 1152   CL 102 03/16/2014 1152   CO2 28 03/16/2014 1152   GLUCOSE 76 03/16/2014 1152   BUN 9 03/16/2014 1152   CREATININE 0.9 03/16/2014 1152   CALCIUM 8.9 03/16/2014 1152    Lipid Panel     Component Value Date/Time   CHOL 156 10/12/2011 1501   TRIG 146.0 10/12/2011 1501   HDL 47.90 10/12/2011 1501   CHOLHDL 3 10/12/2011 1501   VLDL 29.2 10/12/2011 1501   LDLCALC 79 10/12/2011 1501    CBC    Component Value Date/Time   WBC 4.6 03/16/2014 1152   RBC 3.94 03/16/2014 1152   HGB 12.2 03/16/2014 1152   HCT 35.3 (L) 03/16/2014 1152   PLT 146.0 (L) 03/16/2014 1152   MCV 89.5 03/16/2014 1152   MCHC 34.6 03/16/2014 1152   RDW 11.9 03/16/2014 1152   LYMPHSABS 1.6 03/16/2014 1152   MONOABS 0.8 03/16/2014 1152   EOSABS 0.0 03/16/2014 1152   BASOSABS 0.0 03/16/2014 1152    Hgb A1C No results found for: HGBA1C         Assessment & Plan:   Upper Respiratory Infection:  Given duration of symptoms, will treat with Amoxil 875 mg BID x 10 days Advised her to restart the Xyzal Continue Nasocort If symptoms persist, consider referral to allergy  Return precautions discussed Nicki Reaperegina Jovon Streetman, NP

## 2018-01-16 ENCOUNTER — Ambulatory Visit (INDEPENDENT_AMBULATORY_CARE_PROVIDER_SITE_OTHER): Payer: No Typology Code available for payment source | Admitting: Family Medicine

## 2018-01-16 ENCOUNTER — Other Ambulatory Visit: Payer: Self-pay | Admitting: Family Medicine

## 2018-01-16 ENCOUNTER — Encounter: Payer: Self-pay | Admitting: Family Medicine

## 2018-01-16 VITALS — BP 98/70 | HR 92 | Temp 98.3°F | Ht 69.0 in | Wt 141.8 lb

## 2018-01-16 DIAGNOSIS — R509 Fever, unspecified: Secondary | ICD-10-CM

## 2018-01-16 DIAGNOSIS — R221 Localized swelling, mass and lump, neck: Secondary | ICD-10-CM | POA: Insufficient documentation

## 2018-01-16 DIAGNOSIS — J029 Acute pharyngitis, unspecified: Secondary | ICD-10-CM

## 2018-01-16 DIAGNOSIS — E0590 Thyrotoxicosis, unspecified without thyrotoxic crisis or storm: Secondary | ICD-10-CM

## 2018-01-16 HISTORY — DX: Localized swelling, mass and lump, neck: R22.1

## 2018-01-16 LAB — POCT RAPID STREP A (OFFICE): Rapid Strep A Screen: NEGATIVE

## 2018-01-16 LAB — COMPREHENSIVE METABOLIC PANEL
ALT: 13 U/L (ref 0–35)
AST: 15 U/L (ref 0–37)
Albumin: 3.9 g/dL (ref 3.5–5.2)
Alkaline Phosphatase: 69 U/L (ref 39–117)
BUN: 11 mg/dL (ref 6–23)
CHLORIDE: 104 meq/L (ref 96–112)
CO2: 29 meq/L (ref 19–32)
CREATININE: 0.6 mg/dL (ref 0.40–1.20)
Calcium: 9.9 mg/dL (ref 8.4–10.5)
GFR: 118.57 mL/min (ref >60.00)
Glucose, Bld: 98 mg/dL (ref 70–99)
POTASSIUM: 4.3 meq/L (ref 3.5–5.1)
SODIUM: 140 meq/L (ref 135–145)
Total Bilirubin: 0.4 mg/dL (ref 0.2–1.2)
Total Protein: 7.2 g/dL (ref 6.0–8.3)

## 2018-01-16 LAB — CBC WITH DIFFERENTIAL/PLATELET
BASOS ABS: 0 10*3/uL (ref 0.0–0.1)
Basophils Relative: 0.5 % (ref 0.0–3.0)
EOS ABS: 0.1 10*3/uL (ref 0.0–0.7)
Eosinophils Relative: 1.2 % (ref 0.0–5.0)
HCT: 34.6 % — ABNORMAL LOW (ref 36.0–46.0)
Hemoglobin: 11.6 g/dL — ABNORMAL LOW (ref 12.0–15.0)
LYMPHS ABS: 1.2 10*3/uL (ref 0.7–4.0)
Lymphocytes Relative: 19.8 % (ref 12.0–46.0)
MCHC: 33.6 g/dL (ref 30.0–36.0)
MCV: 88.4 fl (ref 78.0–100.0)
MONO ABS: 0.5 10*3/uL (ref 0.1–1.0)
Monocytes Relative: 7.9 % (ref 3.0–12.0)
NEUTROS ABS: 4.2 10*3/uL (ref 1.4–7.7)
NEUTROS PCT: 70.6 % (ref 43.0–77.0)
PLATELETS: 278 10*3/uL (ref 150.0–400.0)
RBC: 3.92 Mil/uL (ref 3.87–5.11)
RDW: 11.6 % (ref 11.5–15.5)
WBC: 6 10*3/uL (ref 4.0–10.5)

## 2018-01-16 LAB — T4, FREE: Free T4: 3.26 ng/dL — ABNORMAL HIGH (ref 0.60–1.60)

## 2018-01-16 LAB — TSH: TSH: <0.01 u[IU]/mL — ABNORMAL LOW (ref 0.35–4.50)

## 2018-01-16 NOTE — Assessment & Plan Note (Signed)
Complicated by recurrent sweats, low grade temp, achiness, swollen glands only improved with prednisone and NSAIDs. Agree with seeing allergist, continuing antihistamine and nasal spray. ? If other issue- ? Tick borne illness- did pull a lot of ticks off of family. Check labs today. Orders Placed This Encounter  Procedures  . CBC with Differential/Platelet  . Epstein-Barr virus nuclear antigen antibody, IgG  . TSH  . Comprehensive metabolic panel  . T4, free  . [place] spotted fvr abs pnl(IgG+IgM)  . B. burgdorfi antibodies  . POCT rapid strep A   Rapid strep neg.

## 2018-01-16 NOTE — Patient Instructions (Signed)
Great to see you. I will call you with your lab results from today and you can view them online.   

## 2018-01-16 NOTE — Progress Notes (Signed)
Subjective:   Patient ID: Chelsea Frederick, female    DOB: 06-28-1981, 37 y.o.   MRN: [id number]  Chelsea Frederick is a pleasant 37 y.o. year old female who presents to clinic today with Allergic Reaction (headache,sore throat,gland swelling,sweat/ saw Baity--prednison and amox didnt work)  on 01/16/2018  HPI:  Here for ? Allergic reaction.  Saw Nicki Reaperegina Baity on 12/27/17 and again on 01/08/18.  Notes reviewed.   Was seen on 5/23 for headache, ear fullness, runny nose and sore throat for  2 weeks duration. Diagnosed with allergies. She was treated with Prednisone Taper x 6 days. She reports she felt great while on Prednisone, but as soon as she finished it, symptoms returned. She returned with the same symptoms on 01/08/18. She describes the headache as pressure behind her eyes. She was diagnosed with URI and placed on Aomxicillin 875 mg twice daily x 10 days, advised to restart xyzal and continue nasocort. Also referred to an allergist- appointment at the end of July.  Currently taking xyzal and nasocort daily.  Symptoms have returned- feels glands and ears are always swollen. No mucous.  No runny nose. Feverish all the time.  Tired, achy.  Ibuprofen does help.  Current Outpatient Medications on File Prior to Visit  Medication Sig Dispense Refill  . amoxicillin (AMOXIL) 875 MG tablet Take 1 tablet (875 mg total) by mouth 2 (two) times daily. 20 tablet 0  . ibuprofen (ADVIL,MOTRIN) 200 MG tablet Take 400 mg by mouth every 6 (six) hours as needed.    Marland Kitchen. levocetirizine (XYZAL) 2.5 MG/5ML solution Take 2.5 mg by mouth every evening.    . triamcinolone (NASACORT) 55 MCG/ACT AERO nasal inhaler Place into the nose.     No current facility-administered medications on file prior to visit.     No Known Allergies  No past medical history on file.  Past Surgical History:  Procedure Laterality Date  . ANTERIOR CRUCIATE LIGAMENT REPAIR    . TONSILLECTOMY      Family History    Problem Relation Age of Onset  . COPD Mother   . Heart disease Father [id number]       multiple MIs    Social History   Socioeconomic History  . Marital status: Married    Spouse name: Not on file  . Number of children: 1  . Years of education: Not on file  . Highest education level: Not on file  Occupational History  . Occupation: Professor    Associate Professormployer: [employer]    Comment: biochem- [employer]  Social Needs  . Financial resource strain: Not on file  . Food insecurity:    Worry: Not on file    Inability: Not on file  . Transportation needs:    Medical: Not on file    Non-medical: Not on file  Tobacco Use  . Smoking status: Never Smoker  . Smokeless tobacco: Never Used  Substance and Sexual Activity  . Alcohol use: No  . Drug use: No  . Sexual activity: Not on file  Lifestyle  . Physical activity:    Days per week: Not on file    Minutes per session: Not on file  . Stress: Not on file  Relationships  . Social connections:    Talks on phone: Not on file    Gets together: Not on file    Attends religious service: Not on file    Active member of club or organization: Not on file  Attends meetings of clubs or organizations: Not on file    Relationship status: Not on file  . Intimate partner violence:    Fear of current or ex partner: Not on file    Emotionally abused: Not on file    Physically abused: Not on file    Forced sexual activity: Not on file  Other Topics Concern  . Not on file  Social History Narrative  . Not on file   The PMH, PSH, Social History, Family History, Medications, and allergies have been reviewed in [facility], and have been updated if relevant.    Review of Systems  Constitutional: Positive for fever.  HENT: Positive for sinus pressure and sore throat. Negative for trouble swallowing.   Eyes: Negative.   Cardiovascular: Negative.   Gastrointestinal: Negative.   Endocrine: Negative.   Genitourinary: Negative.    Musculoskeletal: Positive for arthralgias.  Allergic/Immunologic: Negative.   Neurological: Negative.   Hematological: Positive for adenopathy.  Psychiatric/Behavioral: Negative.   All other systems reviewed and are negative.      Objective:    BP 98/70   Pulse 92   Temp 98.3 F (36.8 C) (Oral)   Ht 5\' 9"  (1.753 m)   Wt 141 lb 12.8 oz (64.3 kg)   LMP 12/31/2017   SpO2 99%   BMI 20.94 kg/m    Physical Exam    General:  Well-developed,well-nourished,in no acute distress; alert,appropriate and cooperative throughout examination Head:  normocephalic and atraumatic.   Eyes:  vision grossly intact, PERRL Ears:  R ear normal and L ear normal externally, TMs clear bilaterally Nose:  no external deformity.   Mouth:  good dentition.   Neck:  No deformities, masses, or tenderness noted. Lungs:  Normal respiratory effort, chest expands symmetrically. Lungs are clear to auscultation, no crackles or wheezes. Heart:  Normal rate and regular rhythm. S1 and S2 normal without gallop, murmur, click, rub or other extra sounds. Msk:  No deformity or scoliosis noted of thoracic or lumbar spine.   Extremities:  No clubbing, cyanosis, edema, or deformity noted with normal full range of motion of all joints.   Neurologic:  alert & oriented X3 and gait normal.   Skin:  Intact without suspicious lesions or rashes Psych:  Cognition and judgment appear intact. Alert and cooperative with normal attention span and concentration. No apparent delusions, illusions, hallucinations      Assessment & Plan:   Neck fullness - Plan: CBC with Differential/Platelet, Epstein-Barr virus nuclear antigen antibody, IgG, TSH, Comprehensive metabolic panel, T4, free  Sore throat - Plan: POCT rapid strep A No follow-ups on file.

## 2018-01-17 ENCOUNTER — Telehealth: Payer: Self-pay

## 2018-01-17 DIAGNOSIS — R509 Fever, unspecified: Secondary | ICD-10-CM

## 2018-01-17 DIAGNOSIS — J029 Acute pharyngitis, unspecified: Secondary | ICD-10-CM

## 2018-01-17 DIAGNOSIS — R221 Localized swelling, mass and lump, neck: Secondary | ICD-10-CM

## 2018-01-17 LAB — B. BURGDORFI ANTIBODIES

## 2018-01-17 LAB — ROCKY MTN SPOTTED FVR ABS PNL(IGG+IGM)
RMSF IGG: NOT DETECTED
RMSF IGM: NOT DETECTED

## 2018-01-17 LAB — EPSTEIN-BARR VIRUS NUCLEAR ANTIGEN ANTIBODY, IGG: EBV NA IGG: 578 U/mL — AB

## 2018-01-17 NOTE — Telephone Encounter (Signed)
I called and LMOVM stating that more labs came back and Dr. Dayton MartesAron would like to have a couple more labs drawn to please RTC to schedule this and she wants to know how she is feeling today/PEC ok to give the information below to patient and schedule lab visit/thx dmf    Notes recorded by Dianne DunAron, Talia M, MD on 01/17/2018 at 3:29 PM EDT Please call pt to let her know that her lyme disease and [place] spotted fever tests were negative which is great. Her mono test (or epstein barr virus) antibodies were high but this could indicate a recent exposure or current mono infection. They can also be high from past exposure as well. I would like to do further EBV titers- EBV IgM and mono spot when she can come in to look into this further. How is she feeling today?

## 2018-01-17 NOTE — Telephone Encounter (Signed)
-----   Message from Dianne Dunalia M Aron, MD sent at 01/16/2018  5:47 PM EDT ----- Yes she can continue taking Tylenol and Motrin.  Several of her other labs are still pending.  So far though  her blood sugar, liver and kidney function are normal.

## 2018-02-04 ENCOUNTER — Other Ambulatory Visit (INDEPENDENT_AMBULATORY_CARE_PROVIDER_SITE_OTHER): Payer: No Typology Code available for payment source

## 2018-02-04 DIAGNOSIS — R509 Fever, unspecified: Secondary | ICD-10-CM | POA: Diagnosis not present

## 2018-02-04 DIAGNOSIS — J029 Acute pharyngitis, unspecified: Secondary | ICD-10-CM

## 2018-02-04 DIAGNOSIS — R221 Localized swelling, mass and lump, neck: Secondary | ICD-10-CM

## 2018-02-04 LAB — MONONUCLEOSIS SCREEN: Mono Screen: NEGATIVE

## 2018-02-05 ENCOUNTER — Encounter: Payer: Self-pay | Admitting: Endocrinology

## 2018-02-05 ENCOUNTER — Ambulatory Visit: Payer: No Typology Code available for payment source | Admitting: Endocrinology

## 2018-02-05 ENCOUNTER — Ambulatory Visit (INDEPENDENT_AMBULATORY_CARE_PROVIDER_SITE_OTHER): Payer: No Typology Code available for payment source

## 2018-02-05 ENCOUNTER — Encounter (INDEPENDENT_AMBULATORY_CARE_PROVIDER_SITE_OTHER): Payer: Self-pay

## 2018-02-05 ENCOUNTER — Ambulatory Visit: Payer: No Typology Code available for payment source

## 2018-02-05 ENCOUNTER — Encounter: Payer: Self-pay | Admitting: Family Medicine

## 2018-02-05 VITALS — BP 112/60 | HR 73 | Ht 69.0 in | Wt 143.6 lb

## 2018-02-05 DIAGNOSIS — Z23 Encounter for immunization: Secondary | ICD-10-CM

## 2018-02-05 DIAGNOSIS — E061 Subacute thyroiditis: Secondary | ICD-10-CM

## 2018-02-05 LAB — TSH: TSH: 0.02 u[IU]/mL — AB (ref 0.35–4.50)

## 2018-02-05 LAB — SEDIMENTATION RATE: Sed Rate: 17 mm/hr (ref 0–20)

## 2018-02-05 LAB — T4, FREE: FREE T4: 0.69 ng/dL (ref 0.60–1.60)

## 2018-02-05 LAB — EPSTEIN-BARR VIRUS VCA, IGM: EBV VCA IgM: 78.7 U/mL — ABNORMAL HIGH

## 2018-02-05 NOTE — Progress Notes (Addendum)
Pt was seen for second MMR vaccination. Pt given [place] injection in left arm.. Pt was observed and had no reaction. Counseling was given and pt stated 02-03-2018 was her LMP.

## 2018-02-05 NOTE — Progress Notes (Signed)
Patient ID: Chelsea Frederick, female   DOB: [birth year], 37 y.o.   MRN: [id number]                                                                                                              Reason for Appointment:  Hyperthyroidism, new consultation  Referring physician: Arnette Norris, MD   Chief complaint: Evaluation of thyroid   History of Present Illness:   For about 2-3 months until last month she has been complaining about pain in her neck area on the sides and feeling of swollen glands She did not have a typical sore throat but did have some nasal congestion and ear pain as well as headache She also did not feel any pain or discomfort in the lower front of the neck.  She was treated with prednisone in May and this improved her symptoms Also was taking symptomatic treatment with ibuprofen and antihistamines  In late May/June the patient was noticing that she was having excessive sweating and feeling hot for about a month Also at some point was started to have a feeling of heart racing periodically which also lasted up to a month She does not complain of any significant change in appetite or weight She did feel more fatigued and drained for about 3 to 4 weeks  More recently she has felt better with continuing to take ibuprofen Now she does not complain of her neck discomfort and also does not have any fatigue, shakiness, palpitations or heat intolerance/sweating    Wt Readings from Last 3 Encounters:  02/05/18 143 lb 9.6 oz (65.1 kg)  01/16/18 141 lb 12.8 oz (64.3 kg)  01/08/18 139 lb (63 kg)     Treatments so far:  Thyroid function tests as follows:     Lab Results  Component Value Date   FREET4 3.26 (H) 01/16/2018   TSH <0.01 Repeated and verified X2. (L) 01/16/2018    No results found for: THYROTRECAB   Allergies as of 02/05/2018   No Known Allergies     Medication List    as of 02/05/2018  1:23 PM   You have not been prescribed any medications.      History reviewed. No pertinent past medical history.  Past Surgical History:  Procedure Laterality Date  . ANTERIOR CRUCIATE LIGAMENT REPAIR    . TONSILLECTOMY      Family History  Problem Relation Age of Onset  . COPD Mother   . Heart disease Father 44       multiple MIs    Social History:  reports that she has never smoked. She has never used smokeless tobacco. She reports that she does not drink alcohol or use drugs.  Allergies: No Known Allergies   Review of Systems  Constitutional: Negative for weight loss and reduced appetite.  HENT: Negative for trouble swallowing.   Respiratory: Negative for shortness of breath.   Cardiovascular: Negative for palpitations.  Gastrointestinal:       Mild constipation especially when traveling  Endocrine: Negative for fatigue.  She tends to have somewhat irregular menstrual cycles, no recent change.  Not on birth control pills  Musculoskeletal: Negative for joint pain.  Allergic/Immunologic:       Nasal congestion recently better  Neurological: Negative for weakness.       Examination:   BP 112/60 (BP Location: Left Arm, Patient Position: Sitting, Cuff Size: Normal)   Pulse 73   Ht 5' 9"  (1.753 m)   Wt 143 lb 9.6 oz (65.1 kg)   SpO2 98%   BMI 21.21 kg/m    General Appearance:  well-built and nourished, pleasant, not anxious or hyperkinetic.         Eyes: No abnormal prominence, lid lag or stare present.  No swelling of the eyelids   Neck: The thyroid is nonpalpable However right thyroid lobe is tender    There is no lymphadenopathy in the neck .           Heart: normal S1 and S2, no murmurs .          Lungs: breath sounds are normal bilaterally without added sounds  Abdomen: no hepatosplenomegaly or other palpable abnormality  Extremities: hands are warm. No ankle edema.  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  Although she had somewhat atypical symptoms she did have neck pain, increased sweating and palpitations last month indicating subacute thyroiditis which seems to have resolved by itself Previously had treatment with steroids for a week and ibuprofen which helped her inflammatory pain  She does not have any recent symptoms but still has a little tenderness in her right thyroid lobe without thyroid enlargement Objectively she does not appear hyperthyroid also today  Since her neck pain is improved may not need any specific treatment such as prednisone at this time, has only mild objective tenderness  Will need to recheck her thyroid levels and ESR today to confirm   May also need one further follow-up in case she goes through a hypothyroid phase of the thyroiditis  Consult note sent to referring physician  Elayne Snare 02/05/2018, 1:23 PM    Note: This office note was prepared with Dragon voice recognition system technology. Any transcriptional errors that result from this process are unintentional.  ADDENDUM:  Free T4 low normal, ESR normal, TSH still suppressed.  Will need follow-up in another month  Elayne Snare

## 2018-02-08 ENCOUNTER — Ambulatory Visit: Payer: Self-pay | Admitting: Family Medicine

## 2018-02-08 NOTE — Telephone Encounter (Signed)
Pt c/o right ear pain. Pt describes pain as sharp but mild in severity. Pt went swinning yesterday and also uses Q tips. Pt is not having any other sx like H/A, stiff neck,dizziness,vomiting, runny nose or decreased hearing. Care advice provided and pt stated understanding. Offered pt Sat clinic at [facility].Pt accepted appt for 0930 with Dr Abner GreenspanBlyth.  Reason for Disposition . Earache  (Exceptions: brief ear pain of < 60 minutes duration, earache occurring during air travel  Answer Assessment - Initial Assessment Questions 1. LOCATION: "Which ear is involved?"     Right ear  2. ONSET: "When did the ear start hurting"      today 3. SEVERITY: "How bad is the pain?"  (Scale 1-10; mild, moderate or severe)   - MILD (1-3): doesn't interfere with normal activities    - MODERATE (4-7): interferes with normal activities or awakens from sleep    - SEVERE (8-10): excruciating pain, unable to do any normal activities      mild 4. URI SYMPTOMS: " Do you have a runny nose or cough?"     no 5. FEVER: "Do you have a fever?" If so, ask: "What is your temperature, how was it measured, and when did it start?"     no 6. CAUSE: "Have you been swimming recently?", "How often do you use Q-TIPS?", "Have you had any recent air travel or scuba diving?"     Went swimming yesterday at water park- uses Qtips 7. OTHER SYMPTOMS: "Do you have any other symptoms?" (e.g., headache, stiff neck, dizziness, vomiting, runny nose, decreased hearing)     no 8. PREGNANCY: "Is there any chance you are pregnant?" "When was your last menstrual period?"     No LMP: June 29th  Protocols used: [facility]

## 2018-02-09 ENCOUNTER — Ambulatory Visit: Payer: No Typology Code available for payment source | Admitting: Family Medicine

## 2018-02-09 ENCOUNTER — Other Ambulatory Visit: Payer: Self-pay

## 2018-02-09 ENCOUNTER — Encounter: Payer: Self-pay | Admitting: Family Medicine

## 2018-02-09 DIAGNOSIS — H9201 Otalgia, right ear: Secondary | ICD-10-CM | POA: Diagnosis not present

## 2018-02-09 HISTORY — DX: Otalgia, right ear: H92.01

## 2018-02-09 MED ORDER — HYDROCORTISONE-ACETIC ACID 1-2 % OT SOLN: 4.0000 [drp] | Freq: Four times a day (QID) | OTIC | 0 refills | Status: DC | PRN

## 2018-02-09 NOTE — Patient Instructions (Signed)
Otitis Externa Otitis externa is an infection of the outer ear canal. The outer ear canal is the area between the outside of the ear and the eardrum. Otitis externa is sometimes called "swimmer's ear." What are the causes? This condition may be caused by:  Swimming in dirty water.  Moisture in the ear.  An injury to the inside of the ear.  An object stuck in the ear.  A cut or scrape on the outside of the ear.  What increases the risk? This condition is more likely to develop in swimmers. What are the signs or symptoms? The first symptom of this condition is often itching in the ear. Later signs and symptoms include:  Swelling of the ear.  Redness in the ear.  Ear pain. The pain may get worse when you pull on your ear.  Pus coming from the ear.  How is this diagnosed? This condition may be diagnosed by examining the ear and testing fluid from the ear for bacteria and funguses. How is this treated? This condition may be treated with:  Antibiotic ear drops. These are often given for 10-14 days.  Medicine to reduce itching and swelling.  Follow these instructions at home:  If you were prescribed antibiotic ear drops, apply them as told by your health care provider. Do not stop using the antibiotic even if your condition improves.  Take over-the-counter and prescription medicines only as told by your health care provider.  Keep all follow-up visits as told by your health care provider. This is important. How is this prevented?  Keep your ear dry. Use the corner of a towel to dry your ear after you swim or bathe.  Avoid scratching or putting things in your ear. Doing these things can damage the ear canal or remove the protective wax that lines it, which makes it easier for bacteria and funguses to grow.  Avoid swimming in lakes, polluted water, or pools that may not have the right amount of chlorine.  Consider making ear drops and putting 3 or 4 drops in each ear after  you swim. Ask your health care provider about how you can make ear drops. Contact a health care provider if:  You have a fever.  After 3 days your ear is still red, swollen, painful, or draining pus.  Your redness, swelling, or pain gets worse.  You have a severe headache.  You have redness, swelling, pain, or tenderness in the area behind your ear. This information is not intended to replace advice given to you by your health care provider. Make sure you discuss any questions you have with your health care provider. Document Released: 07/24/2005 Document Revised: 08/31/2015 Document Reviewed: 05/03/2015 Elsevier Interactive Patient Education  2018 [employer] Inc[employer]  Eustachian Tube Dysfunction The eustachian tube connects the middle ear to the back of the nose. It regulates air pressure in the middle ear by allowing air to move between the ear and nose. It also helps to drain fluid from the middle ear space. When the eustachian tube does not function properly, air pressure, fluid, or both can build up in the middle ear. Eustachian tube dysfunction can affect one or both ears. What are the causes? This condition happens when the eustachian tube becomes blocked or cannot open normally. This may result from:  Ear infections.  Colds and other upper respiratory infections.  Allergies.  Irritation, such as from cigarette smoke or acid from the stomach coming up into the esophagus (gastroesophageal reflux).  Sudden changes   in air pressure, such as from descending in an airplane.  Abnormal growths in the nose or throat, such as nasal polyps, tumors, or enlarged tissue at the back of the throat (adenoids).  What increases the risk? This condition may be more likely to develop in people who smoke and people who are overweight. Eustachian tube dysfunction may also be more likely to develop in children, especially children who have:  Certain birth defects of the mouth, such as cleft  palate.  Large tonsils and adenoids.  What are the signs or symptoms? Symptoms of this condition may include:  A feeling of fullness in the ear.  Ear pain.  Clicking or popping noises in the ear.  Ringing in the ear.  Hearing loss.  Loss of balance.  Symptoms may get worse when the air pressure around you changes, such as when you travel to an area of high elevation or fly on an airplane. How is this diagnosed? This condition may be diagnosed based on:  Your symptoms.  A physical exam of your ear, nose, and throat.  Tests, such as those that measure: ? The movement of your eardrum (tympanogram). ? Your hearing (audiometry).  How is this treated? Treatment depends on the cause and severity of your condition. If your symptoms are mild, you may be able to relieve your symptoms by moving air into ("popping") your ears. If you have symptoms of fluid in your ears, treatment may include:  Decongestants.  Antihistamines.  Nasal sprays or ear drops that contain medicines that reduce swelling (steroids).  In some cases, you may need to have a procedure to drain the fluid in your eardrum (myringotomy). In this procedure, a small tube is placed in the eardrum to:  Drain the fluid.  Restore the air in the middle ear space.  Follow these instructions at home:  Take over-the-counter and prescription medicines only as told by your health care provider.  Use techniques to help pop your ears as recommended by your health care provider. These may include: ? Chewing gum. ? Yawning. ? Frequent, forceful swallowing. ? Closing your mouth, holding your nose closed, and gently blowing as if you are trying to blow air out of your nose.  Do not do any of the following until your health care provider approves: ? Travel to high altitudes. ? Fly in airplanes. ? Work in a pressurized cabin or room. ? Scuba dive.  Keep your ears dry. Dry your ears completely after showering or  bathing.  Do not smoke.  Keep all follow-up visits as told by your health care provider. This is important. Contact a health care provider if:  Your symptoms do not go away after treatment.  Your symptoms come back after treatment.  You are unable to pop your ears.  You have: ? A fever. ? Pain in your ear. ? Pain in your head or neck. ? Fluid draining from your ear.  Your hearing suddenly changes.  You become very dizzy.  You lose your balance. This information is not intended to replace advice given to you by your health care provider. Make sure you discuss any questions you have with your health care provider. Document Released: 08/20/2015 Document Revised: 12/30/2015 Document Reviewed: 08/12/2014 Elsevier Interactive Patient Education  2018 Elsevier Inc.  

## 2018-02-09 NOTE — Progress Notes (Signed)
Subjective:    Patient ID: Chelsea Frederick, female    DOB: 04/12/1981, 37 y.o.   MRN: [id number]  Chief Complaint  Patient presents with  . Ear Pain    HPI Patient is in today for evaluation onf right ear pain. She had MONO and was having ear pain with that but has felt completely recovered from that for 3 weeks. Then yesterday had sudden onset of right ear pain 4 of 10. It has resolved without intervention and she denies any other symptoms such as hearing loss, discharge, headache congestion. Denies CP/palp/SOB/HA/congestion/fevers/GI or GU c/o. Taking meds as prescribed  History reviewed. No pertinent past medical history.  Past Surgical History:  Procedure Laterality Date  . ANTERIOR CRUCIATE LIGAMENT REPAIR    . TONSILLECTOMY      Family History  Problem Relation Age of Onset  . COPD Mother   . Heart disease Father 78       multiple MIs    Social History   Socioeconomic History  . Marital status: Married    Spouse name: Not on file  . Number of children: 1  . Years of education: Not on file  . Highest education level: Not on file  Occupational History  . 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 Financial resource strain: Not on file  . Food insecurity:    Worry: Not on file    Inability: Not on file  . Transportation needs:    Medical: Not on file    Non-medical: Not on file  Tobacco Use  . Smoking status: Never Smoker  . Smokeless tobacco: Never Used  Substance and Sexual Activity  . Alcohol use: No  . Drug use: No  . Sexual activity: Not on file  Lifestyle  . Physical activity:    Days per week: Not on file    Minutes per session: Not on file  . Stress: Not on file  Relationships  . Social connections:    Talks on phone: Not on file    Gets together: Not on file    Attends religious service: Not on file    Active member of club or organization: Not on file   Attends meetings of clubs or organizations: Not on file    Relationship status: Not on file  . Intimate partner violence:    Fear of current or ex partner: Not on file    Emotionally abused: Not on file    Physically abused: Not on file    Forced sexual activity: Not on file  Other Topics Concern  . Not on file  Social History Narrative  . Not on file    No outpatient medications prior to visit.   No facility-administered medications prior to visit.     No Known Allergies  Review of Systems  Constitutional: Negative for fever.  HENT: Negative for congestion.   Eyes: Negative for blurred vision.  Respiratory: Negative for cough.   Cardiovascular: Negative for chest pain and palpitations.  Gastrointestinal: Negative for vomiting.  Musculoskeletal: Negative for back pain.  Skin: Negative for rash.  Neurological: Negative for loss of consciousness and headaches.       Objective:    Physical Exam  Constitutional: She is oriented to person, place, and time. She appears well-developed and well-nourished. No distress.  HENT:  Head: Normocephalic and atraumatic.  Nose: Nose normal.  Eyes: Right eye exhibits no discharge. Left eye  exhibits no discharge.  Neck: Normal range of motion. Neck supple.  Cardiovascular: Normal rate and regular rhythm.  No murmur heard. Pulmonary/Chest: Effort normal and breath sounds normal.  Abdominal: Soft. Bowel sounds are normal. There is no tenderness.  Musculoskeletal: She exhibits no edema.  Neurological: She is alert and oriented to person, place, and time.  Skin: Skin is warm and dry.  Psychiatric: She has a normal mood and affect.  Nursing note and vitals reviewed.   BP 100/68   Pulse 69   Temp 98.7 F (37.1 C) (Oral)   Resp 14   Ht 5\' 9"  (1.753 m)   Wt 144 lb (65.3 kg)   SpO2 99%   BMI 21.27 kg/m  Wt Readings from Last 3 Encounters:  02/09/18 144 lb (65.3 kg)  02/05/18 143 lb 9.6 oz (65.1 kg)  01/16/18 141 lb 12.8 oz (64.3  kg)     Lab Results  Component Value Date   WBC 6.0 01/16/2018   HGB 11.6 (L) 01/16/2018   HCT 34.6 (L) 01/16/2018   PLT 278.0 01/16/2018   GLUCOSE 98 01/16/2018   CHOL 156 10/12/2011   TRIG 146.0 10/12/2011   HDL 47.90 10/12/2011   LDLCALC 79 10/12/2011   ALT 13 01/16/2018   AST 15 01/16/2018   NA 140 01/16/2018   K 4.3 01/16/2018   CL 104 01/16/2018   CREATININE 0.60 01/16/2018   BUN 11 01/16/2018   CO2 29 01/16/2018   TSH 0.02 (L) 02/05/2018    Lab Results  Component Value Date   TSH 0.02 (L) 02/05/2018   Lab Results  Component Value Date   WBC 6.0 01/16/2018   HGB 11.6 (L) 01/16/2018   HCT 34.6 (L) 01/16/2018   MCV 88.4 01/16/2018   PLT 278.0 01/16/2018   Lab Results  Component Value Date   NA 140 01/16/2018   K 4.3 01/16/2018   CO2 29 01/16/2018   GLUCOSE 98 01/16/2018   BUN 11 01/16/2018   CREATININE 0.60 01/16/2018   BILITOT 0.4 01/16/2018   ALKPHOS 69 01/16/2018   AST 15 01/16/2018   ALT 13 01/16/2018   PROT 7.2 01/16/2018   ALBUMIN 3.9 01/16/2018   CALCIUM 9.9 01/16/2018   GFR 118.57 01/16/2018   Lab Results  Component Value Date   CHOL 156 10/12/2011   Lab Results  Component Value Date   HDL 47.90 10/12/2011   Lab Results  Component Value Date   LDLCALC 79 10/12/2011   Lab Results  Component Value Date   TRIG 146.0 10/12/2011   Lab Results  Component Value Date   CHOLHDL 3 10/12/2011   No results found for: HGBA1C     Assessment & Plan:   Problem List Items Addressed This Visit    Ear pain, right    Likely eustachian tube dysfunction and no pain today. Encouraged nasal saline and valsalva maneuver daily and report if pain worsens on return. Given a paper prescription for Vosol HC drops to use prn since she spends a great deal of time at pool but on exam no sign of otitis externa so she will hold off unless worsens.         I am having Coretta B. Wetsel start on acetic acid-hydrocortisone.  Meds ordered this encounter    Medications  . acetic acid-hydrocortisone (VOSOL-HC) OTIC solution    Sig: Place 4 drops into the right ear 4 (four) times daily as needed.    Dispense:  10 mL    Refill:  0     Penni Homans, MD

## 2018-02-09 NOTE — Assessment & Plan Note (Signed)
Likely eustachian tube dysfunction and no pain today. Encouraged nasal saline and valsalva maneuver daily and report if pain worsens on return. Given a paper prescription for Vosol HC drops to use prn since she spends a great deal of time at pool but on exam no sign of otitis externa so she will hold off unless worsens.

## 2018-02-28 ENCOUNTER — Encounter: Payer: Self-pay | Admitting: Family Medicine

## 2018-02-28 ENCOUNTER — Ambulatory Visit: Payer: No Typology Code available for payment source | Admitting: Family Medicine

## 2018-02-28 DIAGNOSIS — S8992XA Unspecified injury of left lower leg, initial encounter: Secondary | ICD-10-CM

## 2018-02-28 NOTE — Patient Instructions (Signed)
Your exam is very reassuring. This is consistent with a knee sprain with effusion. Icing 15 minutes at a time 3-4 times a day. Ibuprofen 600mg  three times a day with food for pain and inflammation, swelling. Elevate above your heart as much as possible. ACE wrap or compression sleeve if tolerated can help with the swelling also. Straight leg raises, knee extensions 3 sets of 10 once a day to maintain quad strength. Crutches only if needed - this should be the worst that you feel though and it should improve from here. If struggling over next 2-3 weeks would consider an MRI but I think you'll improve from this point.

## 2018-03-03 ENCOUNTER — Encounter: Payer: Self-pay | Admitting: Family Medicine

## 2018-03-03 DIAGNOSIS — S8992XA Unspecified injury of left lower leg, initial encounter: Secondary | ICD-10-CM | POA: Insufficient documentation

## 2018-03-03 HISTORY — DX: Unspecified injury of left lower leg, initial encounter: S89.92XA

## 2018-03-03 NOTE — Assessment & Plan Note (Signed)
patient's exam is reassuring.  Consistent with knee sprain with effusion.  Icing, ibuprofen, elevation.  Compression.  Shown home exercises to do daily.  Consider MRI if not improving over next 2-3 weeks.

## 2018-03-03 NOTE — Progress Notes (Signed)
PCP: Dianne Dun, MD  Subjective:   HPI: Patient is a 37 y.o. female here for left knee injury.  Patient reports she completely improved after last visit. She states on 7/20 she was on a tile floor when she slipped and twisted. Had severe pain anterior left knee and couldn't bear weight initially. Pain much better, down to 2/10 and a soreness. Some associated swelling. Has tried icing, elevating, ibuprofen which have helped. No skin changes, numbness.  History reviewed. No pertinent past medical history.  Current Outpatient Medications on File Prior to Visit  Medication Sig Dispense Refill  . acetic acid-hydrocortisone (VOSOL-HC) OTIC solution Place 4 drops into the right ear 4 (four) times daily as needed. 10 mL 0   No current facility-administered medications on file prior to visit.     Past Surgical History:  Procedure Laterality Date  . ANTERIOR CRUCIATE LIGAMENT REPAIR    . TONSILLECTOMY      No Known Allergies  Social History   Socioeconomic History  . Marital status: Married    Spouse name: Not on file  . Number of children: 1  . Years of education: Not on file  . Highest education level: Not on file  Occupational History  . 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 Financial resource strain: Not on file  . Food insecurity:    Worry: Not on file    Inability: Not on file  . Transportation needs:    Medical: Not on file    Non-medical: Not on file  Tobacco Use  . Smoking status: Never Smoker  . Smokeless tobacco: Never Used  Substance and Sexual Activity  . Alcohol use: No  . Drug use: No  . Sexual activity: Not on file  Lifestyle  . Physical activity:    Days per week: Not on file    Minutes per session: Not on file  . Stress: Not on file  Relationships  . Social connections:    Talks on phone: Not on file    Gets together: Not on file    Attends religious service: Not  on file    Active member of club or organization: Not on file    Attends meetings of clubs or organizations: Not on file    Relationship status: Not on file  . Intimate partner violence:    Fear of current or ex partner: Not on file    Emotionally abused: Not on file    Physically abused: Not on file    Forced sexual activity: Not on file  Other Topics Concern  . Not on file  Social History Narrative  . Not on file    Family History  Problem Relation Age of Onset  . COPD Mother   . Heart disease Father 24       multiple MIs    BP 111/79   Pulse 62   Ht 5\' 9"  (1.753 m)   Wt 142 lb (64.4 kg)   BMI 20.97 kg/m   Review of Systems: See HPI above.     Objective:  Physical Exam:  Gen: NAD, comfortable in exam room  Left knee: Mild effusion.  No other gross deformity, ecchymoses. No TTP. FROM with 5/5 strength flexion and extension. Negative ant/post drawers. Negative valgus/varus testing. Negative lachmanns. Negative mcmurrays, apleys, patellar apprehension. NV intact distally.  Right knee: No deformity. FROM with 5/5 strength. No tenderness to palpation. NVI distally.  Assessment & Plan:  1. Left knee injury - patient's exam is reassuring.  Consistent with knee sprain with effusion.  Icing, ibuprofen, elevation.  Compression.  Shown home exercises to do daily.  Consider MRI if not improving over next 2-3 weeks.

## 2018-03-27 ENCOUNTER — Ambulatory Visit: Payer: No Typology Code available for payment source | Admitting: Endocrinology

## 2018-09-17 ENCOUNTER — Encounter: Payer: Self-pay | Admitting: Family Medicine

## 2018-09-17 ENCOUNTER — Ambulatory Visit: Payer: No Typology Code available for payment source | Admitting: Family Medicine

## 2018-09-17 VITALS — BP 122/68 | HR 87 | Temp 98.8°F | Ht 69.0 in | Wt 148.0 lb

## 2018-09-17 DIAGNOSIS — J111 Influenza due to unidentified influenza virus with other respiratory manifestations: Secondary | ICD-10-CM | POA: Insufficient documentation

## 2018-09-17 DIAGNOSIS — R69 Illness, unspecified: Secondary | ICD-10-CM

## 2018-09-17 DIAGNOSIS — R509 Fever, unspecified: Secondary | ICD-10-CM | POA: Diagnosis not present

## 2018-09-17 HISTORY — DX: Influenza due to unidentified influenza virus with other respiratory manifestations: J11.1

## 2018-09-17 LAB — POC INFLUENZA A&B (BINAX/QUICKVUE)
INFLUENZA B, POC: NEGATIVE
Influenza A, POC: NEGATIVE

## 2018-09-17 MED ORDER — OSELTAMIVIR PHOSPHATE 75 MG PO CAPS: 75.0000 mg | ORAL_CAPSULE | Freq: Two times a day (BID) | ORAL | 0 refills | Status: AC

## 2018-09-17 NOTE — Patient Instructions (Signed)
Viral Illness, Adult °Viruses are tiny germs that can get into a person's body and cause illness. There are many different types of viruses, and they cause many types of illness. Viral illnesses can range from mild to severe. They can affect various parts of the body. °Common illnesses that are caused by a virus include colds and the flu. Viral illnesses also include serious conditions such as HIV/AIDS (human immunodeficiency virus/acquired immunodeficiency syndrome). A few viruses have been linked to certain cancers. °What are the causes? °Many types of viruses can cause illness. Viruses invade cells in your body, multiply, and cause the infected cells to malfunction or die. When the cell dies, it releases more of the virus. When this happens, you develop symptoms of the illness, and the virus continues to spread to other cells. If the virus takes over the function of the cell, it can cause the cell to divide and grow out of control, as is the case when a virus causes cancer. °Different viruses get into the body in different ways. You can get a virus by: °· Swallowing food or water that is contaminated with the virus. °· Breathing in droplets that have been coughed or sneezed into the air by an infected person. °· Touching a surface that has been contaminated with the virus and then touching your eyes, nose, or mouth. °· Being bitten by an insect or animal that carries the virus. °· Having sexual contact with a person who is infected with the virus. °· Being exposed to blood or fluids that contain the virus, either through an open cut or during a transfusion. °If a virus enters your body, your body's defense system (immune system) will try to fight the virus. You may be at higher risk for a viral illness if your immune system is weak. °What are the signs or symptoms? °Symptoms vary depending on the type of virus and the location of the cells that it invades. Common symptoms of the main types of viral illnesses  include: °Cold and flu viruses °· Fever. °· Headache. °· Sore throat. °· Muscle aches. °· Nasal congestion. °· Cough. °Digestive system (gastrointestinal) viruses °· Fever. °· Abdominal pain. °· Nausea. °· Diarrhea. °Liver viruses (hepatitis) °· Loss of appetite. °· Tiredness. °· Yellowing of the skin (jaundice). °Brain and spinal cord viruses °· Fever. °· Headache. °· Stiff neck. °· Nausea and vomiting. °· Confusion or sleepiness. °Skin viruses °· Warts. °· Itching. °· Rash. °Sexually transmitted viruses °· Discharge. °· Swelling. °· Redness. °· Rash. °How is this treated? °Viruses can be difficult to treat because they live within cells. Antibiotic medicines do not treat viruses because these drugs do not get inside cells. Treatment for a viral illness may include: °· Resting and drinking plenty of fluids. °· Medicines to relieve symptoms. These can include over-the-counter medicine for pain and fever, medicines for cough or congestion, and medicines to relieve diarrhea. °· Antiviral medicines. These drugs are available only for certain types of viruses. They may help reduce flu symptoms if taken early. There are also many antiviral medicines for hepatitis and HIV/AIDS. °Some viral illnesses can be prevented with vaccinations. A common example is the flu shot. °Follow these instructions at home: °Medicines ° °· Take over-the-counter and prescription medicines only as told by your health care provider. °· If you were prescribed an antiviral medicine, take it as told by your health care provider. Do not stop taking the medicine even if you start to feel better. °· Be aware of when   antibiotics are needed and when they are not needed. Antibiotics do not treat viruses. If your health care provider thinks that you may have a bacterial infection as well as a viral infection, you may get an antibiotic. °? Do not ask for an antibiotic prescription if you have been diagnosed with a viral illness. That will not make your  illness go away faster. °? Frequently taking antibiotics when they are not needed can lead to antibiotic resistance. When this develops, the medicine no longer works against the bacteria that it normally fights. °General instructions °· Drink enough fluids to keep your urine clear or pale yellow. °· Rest as much as possible. °· Return to your normal activities as told by your health care provider. Ask your health care provider what activities are safe for you. °· Keep all follow-up visits as told by your health care provider. This is important. °How is this prevented? °Take these actions to reduce your risk of viral infection: °· Eat a healthy diet and get enough rest. °· Wash your hands often with soap and water. This is especially important when you are in public places. If soap and water are not available, use hand sanitizer. °· Avoid close contact with friends and family who have a viral illness. °· If you travel to areas where viral gastrointestinal infection is common, avoid drinking water or eating raw food. °· Keep your immunizations up to date. Get a flu shot every year as told by your health care provider. °· Do not share toothbrushes, nail clippers, razors, or needles with other people. °· Always practice safe sex. ° °Contact a health care provider if: °· You have symptoms of a viral illness that do not go away. °· Your symptoms come back after going away. °· Your symptoms get worse. °Get help right away if: °· You have trouble breathing. °· You have a severe headache or a stiff neck. °· You have severe vomiting or abdominal pain. °This information is not intended to replace advice given to you by your health care provider. Make sure you discuss any questions you have with your health care provider. °Document Released: 12/03/2015 Document Revised: 01/05/2016 Document Reviewed: 12/03/2015 °Elsevier Interactive Patient Education © 2019 [employer] Inc[employer] ° °

## 2018-09-17 NOTE — Assessment & Plan Note (Signed)
-  POC flu test is negative however hyperacute symptoms and history are consistent with influenza.  -Will cover with tamiflu x5 days.  -Rest, push fluids, continue OTC supportive treatment.  -Notify clinic of any worsening symptoms.

## 2018-09-17 NOTE — Progress Notes (Signed)
Chelsea Frederick - 38 y.o. female MRN [id number]  Date of birth: [birth year]  Subjective Chief Complaint  Patient presents with  . Fever    ongoing for one day-admits to body aches    HPI Chelsea Frederick is a 38 y.o. female here today with complaint of subjective fever, body aches, headache and cough.  Symptoms started suddenly last night.  She is taking OTC medications with mild improvement.  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  She denies chest pain, shortness of breath, wheezing, nausea, vomiting, diarrhea.   ROS:  A comprehensive ROS was completed and negative except as noted per HPI  No Known Allergies  History reviewed. No pertinent past medical history.  Past Surgical History:  Procedure Laterality Date  . ANTERIOR CRUCIATE LIGAMENT REPAIR    . TONSILLECTOMY      Social History   Socioeconomic History  . Marital status: Married    Spouse name: Not on file  . Number of children: 1  . Years of education: Not on file  . Highest education level: Not on file  Occupational History  . 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 Financial resource strain: Not on file  . Food insecurity:    Worry: Not on file    Inability: Not on file  . Transportation needs:    Medical: Not on file    Non-medical: Not on file  Tobacco Use  . Smoking status: Never Smoker  . Smokeless tobacco: Never Used  Substance and Sexual Activity  . Alcohol use: No  . Drug use: No  . Sexual activity: Not on file  Lifestyle  . Physical activity:    Days per week: Not on file    Minutes per session: Not on file  . Stress: Not on file  Relationships  . Social connections:    Talks on phone: Not on file    Gets together: Not on file    Attends religious service: Not on file    Active member of club or organization: Not on file    Attends meetings of clubs or  organizations: Not on file    Relationship status: Not on file  Other Topics Concern  . Not on file  Social History Narrative  . Not on file    Family History  Problem Relation Age of Onset  . COPD Mother   . Heart disease Father 41       multiple MIs    Health Maintenance  Topic Date Due  . HIV Screening  06/30/1994  . TETANUS/TDAP  06/30/1998  . PAP SMEAR-Modifier  06/30/2000  . INFLUENZA VACCINE  Completed    ----------------------------------------------------------------------------------------------------------------------------------------------------------------------------------------------------------------- Physical Exam BP 122/68   Pulse 87   Temp 98.8 F (37.1 C) (Oral)   Ht 5\' 9"  (1.753 m)   Wt 148 lb (67.1 kg)   SpO2 97%   BMI 21.86 kg/m   Physical Exam Constitutional:      Appearance: Normal appearance.  HENT:     Head: Normocephalic and atraumatic.     Right Ear: Tympanic membrane normal.     Left Ear: Tympanic membrane normal.     Mouth/Throat:     Mouth: Mucous membranes are moist.  Eyes:     General: No scleral icterus. Neck:     Musculoskeletal: Neck supple.  Cardiovascular:     Rate and Rhythm: Normal rate  and regular rhythm.  Pulmonary:     Effort: Pulmonary effort is normal.     Breath sounds: Normal breath sounds.  Lymphadenopathy:     Cervical: No cervical adenopathy.  Skin:    General: Skin is warm and dry.     Findings: No rash.  Neurological:     General: No focal deficit present.     Mental Status: She is alert.  Psychiatric:        Mood and Affect: Mood normal.        Behavior: Behavior normal.     ------------------------------------------------------------------------------------------------------------------------------------------------------------------------------------------------------------------- Assessment and Plan  Influenza-like illness -POC flu test is negative however hyperacute symptoms and history  are consistent with influenza.  -Will cover with tamiflu x5 days.  -Rest, push fluids, continue OTC supportive treatment.  -Notify clinic of any worsening symptoms.

## 2019-07-14 ENCOUNTER — Encounter: Payer: Self-pay | Admitting: Family Medicine

## 2019-07-14 ENCOUNTER — Telehealth (INDEPENDENT_AMBULATORY_CARE_PROVIDER_SITE_OTHER): Payer: No Typology Code available for payment source | Admitting: Family Medicine

## 2019-07-14 VITALS — Temp 99.0°F | Ht 69.0 in | Wt 142.0 lb

## 2019-07-14 DIAGNOSIS — J111 Influenza due to unidentified influenza virus with other respiratory manifestations: Secondary | ICD-10-CM | POA: Diagnosis not present

## 2019-07-14 MED ORDER — OSELTAMIVIR PHOSPHATE 75 MG PO CAPS: 75.0000 mg | ORAL_CAPSULE | Freq: Two times a day (BID) | ORAL | 0 refills | Status: AC

## 2019-07-14 NOTE — Assessment & Plan Note (Addendum)
Subacute symptoms more consistent with influenza, will cover with course of Tamiflu.  She will also go ahead and get tested for COVID as well.  She will self isolate until test returns.  Continue supportive care at home-push fluids and rest.  Tylenol or ibuprofen as needed for fever/aches.   Discussed that if pain in chest is worsening or she has increasing shortness of breath or fatigue she should be seen in ED.  She expresses understanding.

## 2019-07-14 NOTE — Progress Notes (Signed)
Chelsea Frederick - 38 y.o. female MRN [id number]  Date of birth: August 11, 1980   This visit type was conducted due to national recommendations for restrictions regarding the COVID-19 Pandemic (e.g. social distancing).  This format is felt to be most appropriate for this patient at this time.  All issues noted in this document were discussed and addressed.  No physical exam was performed (except for noted visual exam findings with Video Visits).  I discussed the limitations of evaluation and management by telemedicine and the availability of in person appointments. The patient expressed understanding and agreed to proceed.  I connected with@ on 07/14/19 at 10:20 AM EST by a video enabled telemedicine application and verified that I am speaking with the correct person using two identifiers.  Present at visit: Luetta Nutting, DO Boris Lown   Patient Location: Home [address] [place] [address]   Provider location:   [facility].   Chief Complaint  Patient presents with   Fever    Pt c/o fatigue, headache, congestion and fever. Pt reports having a stabbing pain in chest with deep breaths - cough is mild, no production. Fever 100.7 last night, treated with Ibuprofen - fever broke this morning. Pt reports chills and sweats. Headache stared last night, fatigue x 1 day. No known exposure to Covid. Husband was sick about 9 days ago with sinus infection, patient tested negative at that time.    HPI  Chelsea Frederick is a 38 y.o. female who presents via audio/video conferencing for a telehealth visit today.  She has complaint of rapid onset of fatigue, headache, chills, sweats and congestion and fever x1 day.  Tmax 100.7 last night.  She also has sharp pain in chest when taking a deep breath sometimes.  She denies shortness of breath.  Cough has been mild.  Her husband was ill last week and dx with sinus infection.  COVID testing at that time was  negative.      ROS:  A comprehensive ROS was completed and negative except as noted per HPI  History reviewed. No pertinent past medical history.  Past Surgical History:  Procedure Laterality Date   ANTERIOR CRUCIATE LIGAMENT REPAIR     TONSILLECTOMY      Family History  Problem Relation Age of Onset   COPD Mother    Heart disease Father 53       multiple MIs    Social History   Socioeconomic History   Marital status: Married    Spouse name: Not on file   Number of children: 1   Years of education: Not on file   Highest education level: Not on file  Occupational History   Occupation: Professor    Employer: [employer]    Comment: biochem- Engineer, materials strain: Not on file   Food insecurity    Worry: Not on file    Inability: Not on file   Transportation needs    Medical: Not on file    Non-medical: Not on file  Tobacco Use   Smoking status: Never Smoker   Smokeless tobacco: Never Used  Substance and Sexual Activity   Alcohol use: No   Drug use: No   Sexual activity: Not on file  Lifestyle   Physical activity    Days per week: Not on file    Minutes per session: Not on file   Stress: Not on file  Relationships   Social  connections    Talks on phone: Not on file    Gets together: Not on file    Attends religious service: Not on file    Active member of club or organization: Not on file    Attends meetings of clubs or organizations: Not on file    Relationship status: Not on file   Intimate partner violence    Fear of current or ex partner: Not on file    Emotionally abused: Not on file    Physically abused: Not on file    Forced sexual activity: Not on file  Other Topics Concern   Not on file  Social History Narrative   Not on file     Current Outpatient Medications:    oseltamivir (TAMIFLU) 75 MG capsule, Take 1 capsule (75 mg total) by mouth 2 (two) times daily for 5  days., Disp: 10 capsule, Rfl: 0  EXAM:  VITALS per patient if applicable: Temp 99 F (37.2 C) (Oral)    Ht 5\' 9"  (1.753 m)    Wt 142 lb (64.4 kg)    BMI 20.97 kg/m   GENERAL: alert, oriented, appears well and in no acute distress  HEENT: atraumatic, conjunttiva clear, no obvious abnormalities on inspection of external nose and ears  NECK: normal movements of the head and neck  LUNGS: on inspection no signs of respiratory distress, breathing rate appears normal, no obvious gross SOB, gasping or wheezing  CV: no obvious cyanosis  MS: moves all visible extremities without noticeable abnormality  PSYCH/NEURO: pleasant and cooperative, no obvious depression or anxiety, speech and thought processing grossly intact  ASSESSMENT AND PLAN:  Discussed the following assessment and plan:  Influenza-like illness Subacute symptoms more consistent with influenza, will cover with course of Tamiflu.  She will also go ahead and get tested for COVID as well.  She will self isolate until test returns.  Continue supportive care at home-push fluids and rest.  Tylenol or ibuprofen as needed for fever/aches.   Discussed that if pain in chest is worsening or she has increasing shortness of breath or fatigue she should be seen in ED.  She expresses understanding.       I discussed the assessment and treatment plan with the patient. The patient was provided an opportunity to ask questions and all were answered. The patient agreed with the plan and demonstrated an understanding of the instructions.   The patient was advised to call back or seek an in-person evaluation if the symptoms worsen or if the condition fails to improve as anticipated.    , DO

## 2019-07-17 ENCOUNTER — Encounter: Payer: Self-pay | Admitting: Family Medicine

## 2019-09-27 ENCOUNTER — Encounter: Payer: Self-pay | Admitting: Family Medicine

## 2019-09-29 ENCOUNTER — Telehealth: Payer: No Typology Code available for payment source | Admitting: Family Medicine

## 2019-09-29 NOTE — Telephone Encounter (Signed)
She is a Special educational needs teacher patient.  Is she planning on transferring care to me here?

## 2019-10-01 NOTE — Telephone Encounter (Signed)
Cancelled appointment. Patient adivsed.

## 2019-10-06 ENCOUNTER — Ambulatory Visit (INDEPENDENT_AMBULATORY_CARE_PROVIDER_SITE_OTHER): Payer: No Typology Code available for payment source

## 2019-10-06 ENCOUNTER — Ambulatory Visit (INDEPENDENT_AMBULATORY_CARE_PROVIDER_SITE_OTHER): Payer: No Typology Code available for payment source | Admitting: Family Medicine

## 2019-10-06 ENCOUNTER — Encounter: Payer: Self-pay | Admitting: Family Medicine

## 2019-10-06 ENCOUNTER — Other Ambulatory Visit: Payer: Self-pay

## 2019-10-06 VITALS — BP 115/78 | HR 82 | Temp 97.8°F | Ht 69.0 in | Wt 144.4 lb

## 2019-10-06 DIAGNOSIS — D649 Anemia, unspecified: Secondary | ICD-10-CM | POA: Insufficient documentation

## 2019-10-06 DIAGNOSIS — Z0000 Encounter for general adult medical examination without abnormal findings: Secondary | ICD-10-CM | POA: Diagnosis not present

## 2019-10-06 DIAGNOSIS — R0609 Other forms of dyspnea: Secondary | ICD-10-CM

## 2019-10-06 DIAGNOSIS — Z8616 Personal history of COVID-19: Secondary | ICD-10-CM

## 2019-10-06 DIAGNOSIS — R0600 Dyspnea, unspecified: Secondary | ICD-10-CM

## 2019-10-06 HISTORY — DX: Anemia, unspecified: D64.9

## 2019-10-06 HISTORY — DX: Personal history of COVID-19: Z86.16

## 2019-10-06 HISTORY — DX: Dyspnea, unspecified: R06.00

## 2019-10-06 HISTORY — DX: Other forms of dyspnea: R06.09

## 2019-10-06 NOTE — Progress Notes (Signed)
Subjective:  Patient ID: Chelsea Frederick, female    DOB: Oct 20, 1980  Age: 39 y.o. MRN: [id number]  CC: Follow-up (follow up after COVID, pt positive in Dec. pt states that she still having palpitations and SOB)   HPI Chelsea Frederick presents for evaluation of lingering dyspnea on exertion, tachycardia and chest pressure status post Covid infection 3 months ago.  Save the symptoms she has recovered entirely from her infection.  According to her smart watch she achieves pulse rates up to 180 with minimal exercise with a slow return to normal.  Is difficult for her to climb stairs or do any kind of a cardio workout since her infection.  Dad had his first MI at age 6 and died at age 41.  Patient does not smoke drink alcohol or use illicit drugs.  She is healthy and active.  She lives with her husband children.  She sleeps on one pillow and her legs do not swell.  No prior history of cardiac issues.  No outpatient medications prior to visit.   No facility-administered medications prior to visit.    ROS Review of Systems  Constitutional: Negative.   HENT: Negative.   Eyes: Negative for photophobia and visual disturbance.  Respiratory: Positive for chest tightness and shortness of breath. Negative for wheezing.   Cardiovascular: Positive for palpitations. Negative for chest pain and leg swelling.  Gastrointestinal: Negative.   Genitourinary: Negative.   Neurological: Negative.   Hematological: Negative.   Psychiatric/Behavioral: Negative.     Objective:  BP 115/78   Pulse 82   Temp 97.8 F (36.6 C) (Tympanic)   Ht 5\' 9"  (1.753 m)   Wt 144 lb 6.4 oz (65.5 kg)   SpO2 98%   BMI 21.32 kg/m   BP Readings from Last 3 Encounters:  10/06/19 115/78  09/17/18 122/68  02/28/18 111/79    Wt Readings from Last 3 Encounters:  10/06/19 144 lb 6.4 oz (65.5 kg)  07/14/19 142 lb (64.4 kg)  09/17/18 148 lb (67.1 kg)    Physical Exam Constitutional:      General: She is  not in acute distress.    Appearance: Normal appearance. She is normal weight. She is not ill-appearing, toxic-appearing or diaphoretic.  HENT:     Head: Normocephalic and atraumatic.     Right Ear: Tympanic membrane and ear canal normal.     Left Ear: Tympanic membrane and ear canal normal.  Eyes:     General:        Right eye: No discharge.        Left eye: No discharge.     Extraocular Movements: Extraocular movements intact.     Conjunctiva/sclera: Conjunctivae normal.     Pupils: Pupils are equal, round, and reactive to light.  Cardiovascular:     Rate and Rhythm: Normal rate and regular rhythm.  Pulmonary:     Effort: Pulmonary effort is normal.     Breath sounds: Normal breath sounds.  Musculoskeletal:     Cervical back: No rigidity or tenderness.     Right lower leg: No edema.     Left lower leg: No edema.  Lymphadenopathy:     Cervical: No cervical adenopathy.  Skin:    General: Skin is warm and dry.  Neurological:     Mental Status: She is alert and oriented to person, place, and time.  Psychiatric:        Mood and Affect: Mood normal.  Behavior: Behavior normal.     Lab Results  Component Value Date   WBC 6.0 01/16/2018   HGB 11.6 (L) 01/16/2018   HCT 34.6 (L) 01/16/2018   PLT 278.0 01/16/2018   GLUCOSE 98 01/16/2018   CHOL 156 10/12/2011   TRIG 146.0 10/12/2011   HDL 47.90 10/12/2011   LDLCALC 79 10/12/2011   ALT 13 01/16/2018   AST 15 01/16/2018   NA 140 01/16/2018   K 4.3 01/16/2018   CL 104 01/16/2018   CREATININE 0.60 01/16/2018   BUN 11 01/16/2018   CO2 29 01/16/2018   TSH 0.02 (L) 02/05/2018    DG Knee Complete 4 Views Left  Result Date: 05/21/2017 CLINICAL DATA:  39 year old female with left knee pain. Anterior cruciate ligament repair 16 years ago. No reported recent injury. Initial encounter. EXAM: LEFT KNEE - COMPLETE 4+ VIEW COMPARISON:  11/07/2011. FINDINGS: Post anterior cruciate ligament repair with screws in place and tibial  tunnel noted. Minimal patellofemoral joint degenerative changes. Suggestion of tiny loose body lateral compartment. Questionable tiny joint effusion. No fracture or dislocation. IMPRESSION: Post anterior cruciate ligament repair. Minimal patellofemoral joint degenerative changes. Suggestion of tiny loose body lateral compartment. Electronically Signed   By: Lacy Duverney M.D.   On: 05/21/2017 18:51    Assessment & Plan:   Problem List Items Addressed This Visit      Other   Healthcare maintenance   Relevant Orders   CBC   Comprehensive metabolic panel   LDL cholesterol, direct   Lipid panel   Urinalysis, Routine w reflex microscopic   Personal history of covid-19 - Primary   Relevant Orders   EKG 12-Lead   Ambulatory referral to Cardiology   DOE (dyspnea on exertion)   Relevant Orders   EKG 12-Lead   Ambulatory referral to Cardiology   DG Chest 2 View   Anemia   Relevant Orders   CBC   TSH   Iron, TIBC and Ferritin Panel     No orders of the defined types were placed in this encounter.     Follow-up: Return in about 1 month (around 11/06/2019).   EKG regular rate and rhythm poor r wave prog. No cardiac history.   Mliss Sax, MD

## 2019-10-08 ENCOUNTER — Other Ambulatory Visit: Payer: Self-pay

## 2019-10-09 ENCOUNTER — Other Ambulatory Visit (INDEPENDENT_AMBULATORY_CARE_PROVIDER_SITE_OTHER): Payer: No Typology Code available for payment source

## 2019-10-09 DIAGNOSIS — Z0000 Encounter for general adult medical examination without abnormal findings: Secondary | ICD-10-CM

## 2019-10-09 DIAGNOSIS — D649 Anemia, unspecified: Secondary | ICD-10-CM | POA: Diagnosis not present

## 2019-10-09 LAB — URINALYSIS, ROUTINE W REFLEX MICROSCOPIC
Bilirubin Urine: NEGATIVE
Hgb urine dipstick: NEGATIVE
Ketones, ur: NEGATIVE
Leukocytes,Ua: NEGATIVE
Nitrite: NEGATIVE
RBC / HPF: NONE SEEN (ref 0–?)
Specific Gravity, Urine: 1.01 (ref 1.000–1.030)
Total Protein, Urine: NEGATIVE
Urine Glucose: NEGATIVE
Urobilinogen, UA: 0.2 (ref 0.0–1.0)
pH: 6.5 (ref 5.0–8.0)

## 2019-10-09 LAB — COMPREHENSIVE METABOLIC PANEL
ALT: 10 U/L (ref 0–35)
AST: 16 U/L (ref 0–37)
Albumin: 4.4 g/dL (ref 3.5–5.2)
Alkaline Phosphatase: 48 U/L (ref 39–117)
BUN: 10 mg/dL (ref 6–23)
CO2: 29 mEq/L (ref 19–32)
Calcium: 9.9 mg/dL (ref 8.4–10.5)
Chloride: 103 mEq/L (ref 96–112)
Creatinine, Ser: 0.88 mg/dL (ref 0.40–1.20)
GFR: 71.07 mL/min (ref >60.00)
Glucose, Bld: 86 mg/dL (ref 70–99)
Potassium: 4.9 mEq/L (ref 3.5–5.1)
Sodium: 138 mEq/L (ref 135–145)
Total Bilirubin: 0.6 mg/dL (ref 0.2–1.2)
Total Protein: 7.2 g/dL (ref 6.0–8.3)

## 2019-10-09 LAB — LIPID PANEL
Cholesterol: 165 mg/dL (ref 0–200)
HDL: 70.7 mg/dL (ref >39.00)
LDL Cholesterol: 83 mg/dL (ref 0–99)
NonHDL: 93.99
Total CHOL/HDL Ratio: 2
Triglycerides: 55 mg/dL (ref 0.0–149.0)
VLDL: 11 mg/dL (ref 0.0–40.0)

## 2019-10-09 LAB — CBC
HCT: 39 % (ref 36.0–46.0)
Hemoglobin: 13 g/dL (ref 12.0–15.0)
MCHC: 33.3 g/dL (ref 30.0–36.0)
MCV: 92.6 fl (ref 78.0–100.0)
Platelets: 173 10*3/uL (ref 150.0–400.0)
RBC: 4.21 Mil/uL (ref 3.87–5.11)
RDW: 12.6 % (ref 11.5–15.5)
WBC: 4.5 10*3/uL (ref 4.0–10.5)

## 2019-10-09 LAB — IRON,TIBC AND FERRITIN PANEL
%SAT: 7 % (calc) — ABNORMAL LOW (ref 16–45)
Ferritin: 58 ng/mL (ref 16–154)
Iron: 21 ug/dL — ABNORMAL LOW (ref 40–190)
TIBC: 283 mcg/dL (calc) (ref 250–450)

## 2019-10-09 LAB — LDL CHOLESTEROL, DIRECT: Direct LDL: 79 mg/dL

## 2019-10-09 LAB — TSH: TSH: 1.02 u[IU]/mL (ref 0.35–4.50)

## 2019-10-16 ENCOUNTER — Ambulatory Visit (INDEPENDENT_AMBULATORY_CARE_PROVIDER_SITE_OTHER): Payer: No Typology Code available for payment source | Admitting: Cardiology

## 2019-10-16 ENCOUNTER — Other Ambulatory Visit: Payer: Self-pay

## 2019-10-16 ENCOUNTER — Encounter: Payer: Self-pay | Admitting: Cardiology

## 2019-10-16 VITALS — BP 102/78 | HR 70 | Temp 97.9°F | Resp 18 | Ht 69.0 in | Wt 144.0 lb

## 2019-10-16 DIAGNOSIS — R9431 Abnormal electrocardiogram [ECG] [EKG]: Secondary | ICD-10-CM

## 2019-10-16 DIAGNOSIS — R002 Palpitations: Secondary | ICD-10-CM

## 2019-10-16 DIAGNOSIS — R011 Cardiac murmur, unspecified: Secondary | ICD-10-CM

## 2019-10-16 DIAGNOSIS — Z8616 Personal history of COVID-19: Secondary | ICD-10-CM

## 2019-10-16 DIAGNOSIS — R0609 Other forms of dyspnea: Secondary | ICD-10-CM

## 2019-10-16 DIAGNOSIS — R0600 Dyspnea, unspecified: Secondary | ICD-10-CM

## 2019-10-16 HISTORY — DX: Abnormal electrocardiogram (ECG) (EKG): R94.31

## 2019-10-16 HISTORY — DX: Cardiac murmur, unspecified: R01.1

## 2019-10-16 HISTORY — DX: Palpitations: R00.2

## 2019-10-16 NOTE — Patient Instructions (Signed)
Medication Instructions:  No medication changes. *If you need a refill on your cardiac medications before your next appointment, please call your pharmacy*   Lab Work: None ordered If you have labs (blood work) drawn today and your tests are completely normal, you will receive your results only by: . MyChart Message (if you have MyChart) OR . A paper copy in the mail If you have any lab test that is abnormal or we need to change your treatment, we will call you to review the results.   Testing/Procedures: Your physician has requested that you have an echocardiogram. Echocardiography is a painless test that uses sound waves to create images of your heart. It provides your doctor with information about the size and shape of your heart and how well your heart's chambers and valves are working. This procedure takes approximately one hour. There are no restrictions for this procedure.     Follow-Up: At [facility], you and your health needs are our priority.  As part of our continuing mission to provide you with exceptional heart care, we have created designated Provider Care Teams.  These Care Teams include your primary Cardiologist (physician) and Advanced Practice Providers (APPs -  Physician Assistants and Nurse Practitioners) who all work together to provide you with the care you need, when you need it.  We recommend signing up for the patient portal called "MyChart".  Sign up information is provided on this After Visit Summary.  MyChart is used to connect with patients for Virtual Visits (Telemedicine).  Patients are able to view lab/test results, encounter notes, upcoming appointments, etc.  Non-urgent messages can be sent to your provider as well.   To learn more about what you can do with MyChart, go to [email or web address].    Your next appointment:   6 month(s)  The format for your next appointment:   In Person  Provider:   Rajan Revankar, MD   Other  Instructions  Echocardiogram An echocardiogram is a procedure that uses painless sound waves (ultrasound) to produce an image of the heart. Images from an echocardiogram can provide important information about:  Signs of coronary artery disease (CAD).  Aneurysm detection. An aneurysm is a weak or damaged part of an artery wall that bulges out from the normal force of blood pumping through the body.  Heart size and shape. Changes in the size or shape of the heart can be associated with certain conditions, including heart failure, aneurysm, and CAD.  Heart muscle function.  Heart valve function.  Signs of a past heart attack.  Fluid buildup around the heart.  Thickening of the heart muscle.  A tumor or infectious growth around the heart valves. Tell a health care provider about:  Any allergies you have.  All medicines you are taking, including vitamins, herbs, eye drops, creams, and over-the-counter medicines.  Any blood disorders you have.  Any surgeries you have had.  Any medical conditions you have.  Whether you are pregnant or may be pregnant. What are the risks? Generally, this is a safe procedure. However, problems may occur, including:  Allergic reaction to dye (contrast) that may be used during the procedure. What happens before the procedure? No specific preparation is needed. You may eat and drink normally. What happens during the procedure?   An IV tube may be inserted into one of your veins.  You may receive contrast through this tube. A contrast is an injection that improves the quality of the pictures from your heart.  A   gel will be applied to your chest.  A wand-like tool (transducer) will be moved over your chest. The gel will help to transmit the sound waves from the transducer.  The sound waves will harmlessly bounce off of your heart to allow the heart images to be captured in real-time motion. The images will be recorded on a computer. The  procedure may vary among health care providers and hospitals. What happens after the procedure?  You may return to your normal, everyday life, including diet, activities, and medicines, unless your health care provider tells you not to do that. Summary  An echocardiogram is a procedure that uses painless sound waves (ultrasound) to produce an image of the heart.  Images from an echocardiogram can provide important information about the size and shape of your heart, heart muscle function, heart valve function, and fluid buildup around your heart.  You do not need to do anything to prepare before this procedure. You may eat and drink normally.  After the echocardiogram is completed, you may return to your normal, everyday life, unless your health care provider tells you not to do that. This information is not intended to replace advice given to you by your health care provider. Make sure you discuss any questions you have with your health care provider. Document Revised: 11/14/2018 Document Reviewed: 08/26/2016 Elsevier Patient Education  2020 [employer]   

## 2019-10-16 NOTE — Progress Notes (Signed)
Cardiology Office Note:    Date:  10/16/2019   ID:  Chelsea Frederick, DOB 12-Sep-1980, MRN [id number]  PCP:  Libby Maw, MD  Cardiologist:  Jenean Lindau, MD   Referring MD: Libby Maw,*    ASSESSMENT:    1. DOE (dyspnea on exertion)   2. Palpitations   3. Cardiac murmur   4. Abnormal EKG   5. Personal history of covid-19    PLAN:    In order of problems listed above:  1. Post Covid infection: Palpitations and dyspnea on exertion: Patient is very concerned about the symptoms.  She also has an abnormal EKG which reveals poor anterior forces suggesting old myocardial infarction.  I think this is not truly reflective of her cardiac status.  She is very concerned about these issues fortunately they have almost resolved.  Her TSH is fine.  Her lipids are reviewed and they are fine.  She has not begun exercises and bicycles with her children without any symptoms and I am happy about it.  Echocardiogram will be done to assess murmur on auscultation I do not think she needs any monitoring because her symptoms of palpitations have resolved.  She has a smart watch and she checks her heart rates and they appear to be fine.Patient will be seen in follow-up appointment in 6 months or earlier if the patient has any concerns 2. Patient had multiple questions which were answered to her satisfaction.   Medication Adjustments/Labs and Tests Ordered: Current medicines are reviewed at length with the patient today.  Concerns regarding medicines are outlined above.  No orders of the defined types were placed in this encounter.  No orders of the defined types were placed in this encounter.    History of Present Illness:    Chelsea Frederick is a 39 y.o. female who is being seen today for the evaluation of palpitations and dyspnea on exertion at the request of Libby Maw,*.  Patient is a pleasant 39 year old female.  She has no significant past medical  history.  Patient mentions to me that she suffered with COVID-19 infection.  She is a Music therapist at [employer].  She mentions to me that after Covid infection she was getting significantly short of breath.  No chest pain orthopnea or PND.  She also experienced palpitations and shortness of breath with minimal exertion.  She got better in February but subsequently this got worse early March.  Fortunately she feels much better now she has begun exercise.  She keeps a track of her heart rates and they are fine and resolved.  Her shortness of breath also is much better and she is happy about it.  At the time of my evaluation, the patient is alert awake oriented and in no distress.  History reviewed. No pertinent past medical history.  Past Surgical History:  Procedure Laterality Date  . ANTERIOR CRUCIATE LIGAMENT REPAIR    . TONSILLECTOMY      Current Medications: Current Meds  Medication Sig  . Multiple Vitamin (MULTIVITAMIN) tablet Take 1 tablet by mouth daily.     Allergies:   Patient has no known allergies.   Social History   Socioeconomic History  . Marital status: Married    Spouse name: Not on file  . Number of children: 1  . Years of education: Not on file  . Highest education level: Not on file  Occupational History  . Occupation: Professor    Employer: [employer]  Comment: biochem- [employer]  Tobacco Use  . Smoking status: Never Smoker  . Smokeless tobacco: Never Used  Substance and Sexual Activity  . Alcohol use: Yes    Comment: occasional  . Drug use: No  . Sexual activity: Yes    Birth control/protection: None  Other Topics Concern  . Not on file  Social History Narrative  . Not on file   Social Determinants of Health   Financial Resource Strain:   . Difficulty of Paying Living Expenses:   Food Insecurity:   . Worried About Programme researcher, broadcasting/film/video in the Last Year:   . Barista in the Last Year:     Transportation Needs:   . Freight forwarder (Medical):   Marland Kitchen Lack of Transportation (Non-Medical):   Physical Activity:   . Days of Exercise per Week:   . Minutes of Exercise per Session:   Stress:   . Feeling of Stress :   Social Connections:   . Frequency of Communication with Friends and Family:   . Frequency of Social Gatherings with Friends and Family:   . Attends Religious Services:   . Active Member of Clubs or Organizations:   . Attends Banker Meetings:   Marland Kitchen Marital Status:      Family History: The patient's family history includes COPD in her mother; Heart disease (age of onset: 69) in her father.  ROS:   Please see the history of present illness.    All other systems reviewed and are negative.  EKGs/Labs/Other Studies Reviewed:    The following studies were reviewed today: EKG reveals sinus rhythm with poor anterior forces.   Recent Labs: 10/09/2019: ALT 10; BUN 10; Creatinine, Ser 0.88; Hemoglobin 13.0; Platelets 173.0; Potassium 4.9; Sodium 138; TSH 1.02  Recent Lipid Panel    Component Value Date/Time   CHOL 165 10/09/2019 0804   TRIG 55.0 10/09/2019 0804   HDL 70.70 10/09/2019 0804   CHOLHDL 2 10/09/2019 0804   VLDL 11.0 10/09/2019 0804   LDLCALC 83 10/09/2019 0804   LDLDIRECT 79.0 10/09/2019 0804    Physical Exam:    VS:  BP 102/78 (BP Location: Right Arm, Patient Position: Sitting, Cuff Size: Normal)   Pulse 70   Temp 97.9 F (36.6 C)   Resp 18   Ht 5\' 9"  (1.753 m)   Wt 144 lb 0.4 oz (65.3 kg)   SpO2 99%   BMI 21.27 kg/m     Wt Readings from Last 3 Encounters:  10/16/19 144 lb 0.4 oz (65.3 kg)  10/06/19 144 lb 6.4 oz (65.5 kg)  07/14/19 142 lb (64.4 kg)     GEN: Patient is in no acute distress HEENT: Normal NECK: No JVD; No carotid bruits LYMPHATICS: No lymphadenopathy CARDIAC: S1 S2 regular, 2/6 systolic murmur at the apex. RESPIRATORY:  Clear to auscultation without rales, wheezing or rhonchi  ABDOMEN: Soft,  non-tender, non-distended MUSCULOSKELETAL:  No edema; No deformity  SKIN: Warm and dry NEUROLOGIC:  Alert and oriented x 3 PSYCHIATRIC:  Normal affect    Signed, 14/07/20, MD  10/16/2019 9:52 AM    [facility]

## 2019-10-21 ENCOUNTER — Ambulatory Visit (HOSPITAL_BASED_OUTPATIENT_CLINIC_OR_DEPARTMENT_OTHER)
Admission: RE | Admit: 2019-10-21 | Discharge: 2019-10-21 | Disposition: A | Discharge status: Home / Self Care | Payer: No Typology Code available for payment source | Source: Ambulatory Visit | Attending: Cardiology | Admitting: Cardiology

## 2019-10-21 ENCOUNTER — Other Ambulatory Visit: Payer: Self-pay

## 2019-10-21 DIAGNOSIS — R011 Cardiac murmur, unspecified: Secondary | ICD-10-CM | POA: Insufficient documentation

## 2019-10-21 DIAGNOSIS — R0600 Dyspnea, unspecified: Secondary | ICD-10-CM | POA: Diagnosis present

## 2019-10-21 NOTE — Progress Notes (Signed)
  Echocardiogram 2D Echocardiogram has been performed.  Chelsea Frederick 10/21/2019, 12:20 PM

## 2019-11-11 ENCOUNTER — Ambulatory Visit: Payer: No Typology Code available for payment source | Admitting: Family Medicine

## 2019-11-18 ENCOUNTER — Telehealth: Payer: Self-pay | Admitting: Family Medicine

## 2019-11-18 NOTE — Telephone Encounter (Signed)
LVM RE: Need to reschedule appt cancelled from 11/11/19.

## 2019-12-24 ENCOUNTER — Encounter: Payer: Self-pay | Admitting: Cardiology

## 2020-04-20 ENCOUNTER — Other Ambulatory Visit: Payer: Self-pay

## 2020-04-21 ENCOUNTER — Ambulatory Visit: Payer: No Typology Code available for payment source | Admitting: Cardiology

## 2020-04-21 ENCOUNTER — Ambulatory Visit (INDEPENDENT_AMBULATORY_CARE_PROVIDER_SITE_OTHER): Payer: No Typology Code available for payment source | Admitting: Cardiology

## 2020-04-21 ENCOUNTER — Other Ambulatory Visit: Payer: Self-pay

## 2020-04-21 ENCOUNTER — Encounter: Payer: Self-pay | Admitting: Cardiology

## 2020-04-21 VITALS — BP 116/68 | HR 80 | Ht 69.0 in | Wt 147.0 lb

## 2020-04-21 DIAGNOSIS — R002 Palpitations: Secondary | ICD-10-CM | POA: Diagnosis not present

## 2020-04-21 NOTE — Patient Instructions (Signed)

## 2020-04-21 NOTE — Progress Notes (Signed)
Cardiology Office Note:    Date:  04/21/2020   ID:  Chelsea Frederick, DOB 1981/03/06, MRN [id number]  PCP:  Mliss Sax, MD  Cardiologist:  Garwin Brothers, MD   Referring MD: Mliss Sax,*    ASSESSMENT:    1. Palpitations    PLAN:    In order of problems listed above:  1. Primary prevention stressed with the patient.  Importance of compliance with diet medication stressed and she vocalized understanding. 2. Palpitations: These have resolved and patient is very happy about it.  She is exercising regularly and running at least half an hour on a regular basis.  Without any symptoms. 3. Lipids and other blood work was discussed with her and she will be now seen in follow-up appointment on a as needed basis only.  Patient had multiple questions which were answered to her satisfaction.   Medication Adjustments/Labs and Tests Ordered: Current medicines are reviewed at length with the patient today.  Concerns regarding medicines are outlined above.  No orders of the defined types were placed in this encounter.  No orders of the defined types were placed in this encounter.    No chief complaint on file.    History of Present Illness:    Chelsea Frederick is a 39 y.o. female.  Patient has history of palpitations.  She denies any problems at this time and takes care of activities of daily living.  No chest pain orthopnea or PND.  Her palpitations have resolved.  At the time of my evaluation, the patient is alert awake oriented and in no distress.  Past Medical History:  Diagnosis Date  . Abnormal EKG 10/16/2019  . Anemia 10/06/2019  . Cardiac murmur 10/16/2019  . DOE (dyspnea on exertion) 10/06/2019  . Ear pain, right 02/09/2018  . Healthcare maintenance 10/12/2011  . Influenza-like illness 09/17/2018  . Left knee injury, initial encounter 03/03/2018  . Neck fullness 01/16/2018   Formatting of this note might be different from the original. Last  Assessment & Plan:  Formatting of this note is different from the original. Complicated by recurrent sweats, low grade temp, achiness, swollen glands only improved with prednisone and NSAIDs. Agree with seeing allergist, continuing antihistamine and nasal spray. ? If other issue- ? Tick borne illness- did pull a lot of ticks off of  . Palpitations 10/16/2019  . Personal history of covid-19 10/06/2019    Past Surgical History:  Procedure Laterality Date  . ANTERIOR CRUCIATE LIGAMENT REPAIR    . TONSILLECTOMY      Current Medications: Current Meds  Medication Sig  . Multiple Vitamin (MULTIVITAMIN) tablet Take 1 tablet by mouth daily.  Marland Kitchen omeprazole (PRILOSEC) 20 MG capsule Take 20 mg by mouth daily.  . Probiotic CAPS Take 1 capsule by mouth daily.     Allergies:   Patient has no known allergies.   Social History   Socioeconomic History  . Marital status: Married    Spouse name: Not on file  . Number of children: 1  . Years of education: Not on file  . Highest education level: Not on file  Occupational History  . Occupation: Professor    Employer: [employer]    Comment: biochem- [employer]  Tobacco Use  . Smoking status: Never Smoker  . Smokeless tobacco: Never Used  Vaping Use  . Vaping Use: Never used  Substance and Sexual Activity  . Alcohol use: Yes    Comment: occasional  . Drug use: No  .  Sexual activity: Yes    Birth control/protection: None  Other Topics Concern  . Not on file  Social History Narrative  . Not on file   Social Determinants of Health   Financial Resource Strain:   . Difficulty of Paying Living Expenses: Not on file  Food Insecurity:   . Worried About Programme researcher, broadcasting/film/video in the Last Year: Not on file  . Ran Out of Food in the Last Year: Not on file  Transportation Needs:   . Lack of Transportation (Medical): Not on file  . Lack of Transportation (Non-Medical): Not on file  Physical Activity:   . Days of Exercise per Week:  Not on file  . Minutes of Exercise per Session: Not on file  Stress:   . Feeling of Stress : Not on file  Social Connections:   . Frequency of Communication with Friends and Family: Not on file  . Frequency of Social Gatherings with Friends and Family: Not on file  . Attends Religious Services: Not on file  . Active Member of Clubs or Organizations: Not on file  . Attends Banker Meetings: Not on file  . Marital Status: Not on file     Family History: The patient's family history includes COPD in her mother; Heart disease (age of onset: 73) in her father.  ROS:   Please see the history of present illness.    All other systems reviewed and are negative.  EKGs/Labs/Other Studies Reviewed:    The following studies were reviewed today: IMPRESSIONS    1. Left ventricular ejection fraction, by estimation, is 60 to 65%. The  left ventricle has normal function. The left ventricle has no regional  wall motion abnormalities. Left ventricular diastolic parameters were  normal.    Recent Labs: 10/09/2019: ALT 10; BUN 10; Creatinine, Ser 0.88; Hemoglobin 13.0; Platelets 173.0; Potassium 4.9; Sodium 138; TSH 1.02  Recent Lipid Panel    Component Value Date/Time   CHOL 165 10/09/2019 0804   TRIG 55.0 10/09/2019 0804   HDL 70.70 10/09/2019 0804   CHOLHDL 2 10/09/2019 0804   VLDL 11.0 10/09/2019 0804   LDLCALC 83 10/09/2019 0804   LDLDIRECT 79.0 10/09/2019 0804    Physical Exam:    VS:  BP 116/68   Pulse 80   Ht 5\' 9"  (1.753 m)   Wt 147 lb 0.6 oz (66.7 kg)   SpO2 98%   BMI 21.71 kg/m     Wt Readings from Last 3 Encounters:  04/21/20 147 lb 0.6 oz (66.7 kg)  10/16/19 144 lb 0.4 oz (65.3 kg)  10/06/19 144 lb 6.4 oz (65.5 kg)     GEN: Patient is in no acute distress HEENT: Normal NECK: No JVD; No carotid bruits LYMPHATICS: No lymphadenopathy CARDIAC: Hear sounds regular, 2/6 systolic murmur at the apex. RESPIRATORY:  Clear to auscultation without rales,  wheezing or rhonchi  ABDOMEN: Soft, non-tender, non-distended MUSCULOSKELETAL:  No edema; No deformity  SKIN: Warm and dry NEUROLOGIC:  Alert and oriented x 3 PSYCHIATRIC:  Normal affect   Signed, 12/06/19, MD  04/21/2020 9:35 AM    [facility]

## 2021-07-06 ENCOUNTER — Other Ambulatory Visit: Payer: Self-pay

## 2021-07-06 ENCOUNTER — Ambulatory Visit (INDEPENDENT_AMBULATORY_CARE_PROVIDER_SITE_OTHER): Payer: No Typology Code available for payment source | Admitting: Family Medicine

## 2021-07-06 VITALS — BP 118/74 | HR 65 | Temp 97.7°F | Ht 69.0 in | Wt 153.4 lb

## 2021-07-06 DIAGNOSIS — M542 Cervicalgia: Secondary | ICD-10-CM

## 2021-07-06 MED ORDER — NAPROXEN 500 MG PO TABS: 500.0000 mg | ORAL_TABLET | Freq: Two times a day (BID) | ORAL | 0 refills | Status: DC

## 2021-07-06 NOTE — Progress Notes (Signed)
[facility] [address] [place] [address] Dept: [phone] Dept Fax: [phone]  Office Visit  Subjective:    Patient ID: Chelsea Frederick, female    DOB: Oct 02, 1980, 40 y.o..   MRN: [id number]  Chief Complaint  Patient presents with   Acute Visit    C/o having LT side neck pain x 4 weeks.  Has taken Ibuprofen with little relief.      History of Present Illness:  Patient is in today for assessment of a 4-week history of left neck pain. She notes she through this might initially be a wry neck. The pain is localized over the left postero-lateral neck near the base at the shoulder. She describes this as feeling tight. It does not radiate. It is exacerbated with rotation and flexion of the neck tot he left. She has not noted any left arm numbness or weakness. She tried ibuprofen every 5-6 hours for 4-5 days initially, but did not see improvement. She has also used heat over this area and performed stretches. She has seen some mild improvement over the past 4 weeks, but no resolution.  Past Medical History: Patient Active Problem List   Diagnosis Date Noted   Palpitations 10/16/2019   Cardiac murmur 10/16/2019   Abnormal EKG 10/16/2019   Personal history of COVID-19 10/06/2019   DOE (dyspnea on exertion) 10/06/2019   Anemia 10/06/2019   Influenza-like illness 09/17/2018   Left knee injury, initial encounter 03/03/2018   Ear pain, right 02/09/2018   Neck fullness 01/16/2018   Healthcare maintenance 10/12/2011   Past Surgical History:  Procedure Laterality Date   ANTERIOR CRUCIATE LIGAMENT REPAIR     TONSILLECTOMY     Family History  Problem Relation Age of Onset   COPD Mother    Heart disease Father 81       multiple MIs   Outpatient Medications Prior to Visit  Medication Sig Dispense Refill   Multiple Vitamin (MULTIVITAMIN) tablet Take 1 tablet by mouth daily.     Probiotic CAPS Take 1 capsule by mouth  daily.     omeprazole (PRILOSEC) 20 MG capsule Take 20 mg by mouth daily.     No facility-administered medications prior to visit.   No Known Allergies    Objective:   Today's Vitals   07/06/21 1550  BP: 118/74  Pulse: 65  Temp: 97.7 F (36.5 C)  TempSrc: Temporal  SpO2: 98%  Weight: 153 lb 6.4 oz (69.6 kg)  Height: 5\' 9"  (1.753 m)   Body mass index is 22.65 kg/m.   General: Well developed, well nourished. No acute distress. HEENT: Normocephalic, non-traumatic. PERRL, EOMI. Conjunctiva clear. External ears   normal. EAC and TMs normal bilaterally. Nose clear without congestion or rhinorrhea.   Mucous membranes moist. Oropharynx clear. Good dentition. Neck: Supple. No lymphadenopathy. Point tenderness over posterior scalene near   juncture of neck with shoulder. ROM good. Neuro: UE sensation normal. DTR 2+. Strength 5/5. Psych: Alert and oriented. Normal mood and affect.  Health Maintenance Due  Topic Date Due   HIV Screening  Never done   Hepatitis C Screening  Never done   PAP SMEAR-Modifier  Never done   COVID-19 Vaccine (3 - Booster for Pfizer series) 12/31/2019     Assessment & Plan:   1. Myofascial neck pain The pain appears to be muscular in origin. She has failed appropriate conservative management at home. I recommend we refer her to PT to try and resolve. I  will have her use naproxen bid while in PT. Follow-up if not improved with this approach.  - Ambulatory referral to Physical Therapy - naproxen (NAPROSYN) 500 MG tablet; Take 1 tablet (500 mg total) by mouth 2 (two) times daily with a meal.  Dispense: 30 tablet; Refill: 0  Loyola Mast, MD

## 2021-08-10 ENCOUNTER — Ambulatory Visit: Payer: No Typology Code available for payment source | Admitting: Physical Therapy

## 2021-08-15 ENCOUNTER — Ambulatory Visit: Payer: No Typology Code available for payment source | Admitting: Physical Therapy

## 2021-08-24 ENCOUNTER — Encounter: Payer: No Typology Code available for payment source | Admitting: Physical Therapy

## 2021-09-22 IMAGING — DX DG CHEST 2V
2 series · 2 of 2 positions shown · non-contrast
Comparison: None.

CLINICAL DATA: Dyspnea on exertion

EXAM:
CHEST - 2 VIEW

[chest pa]
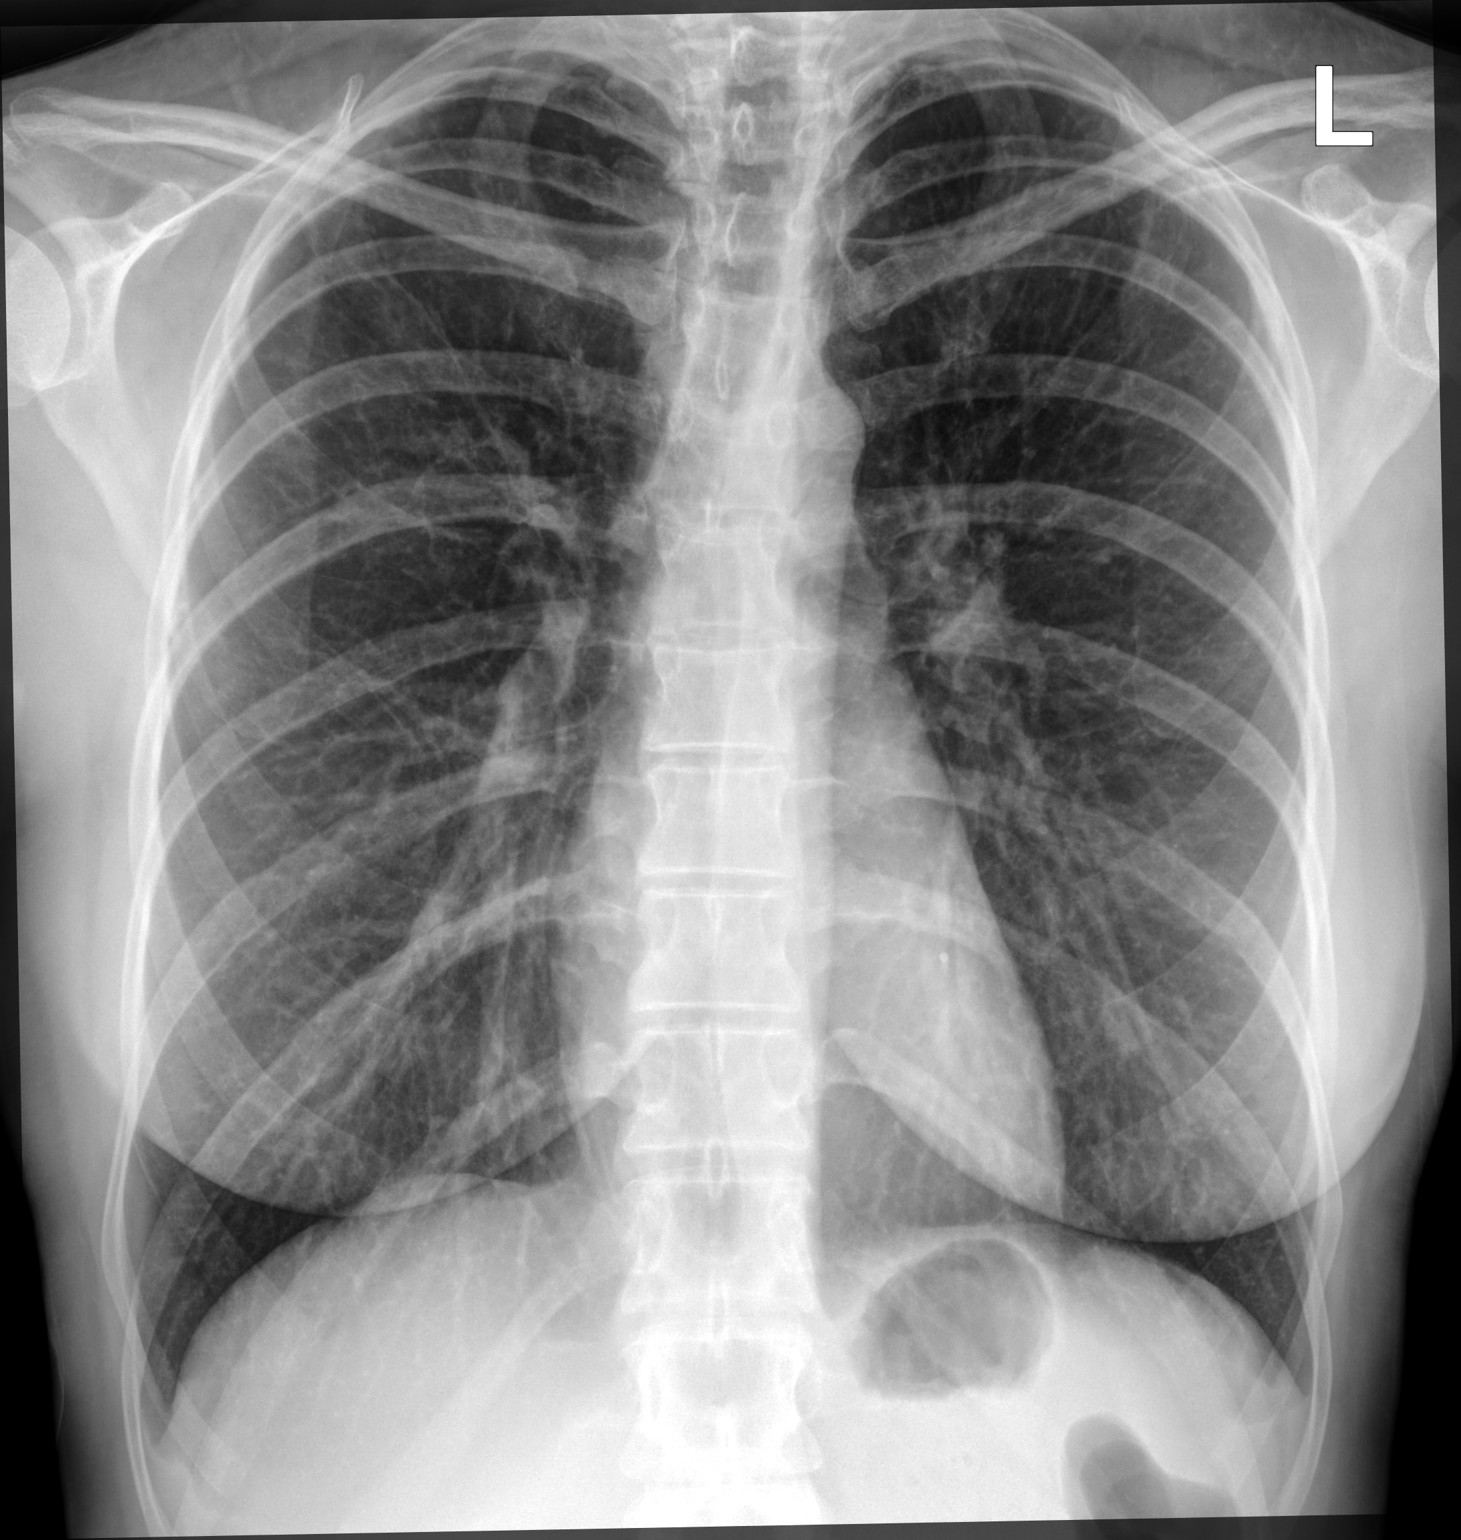

[chest lat]
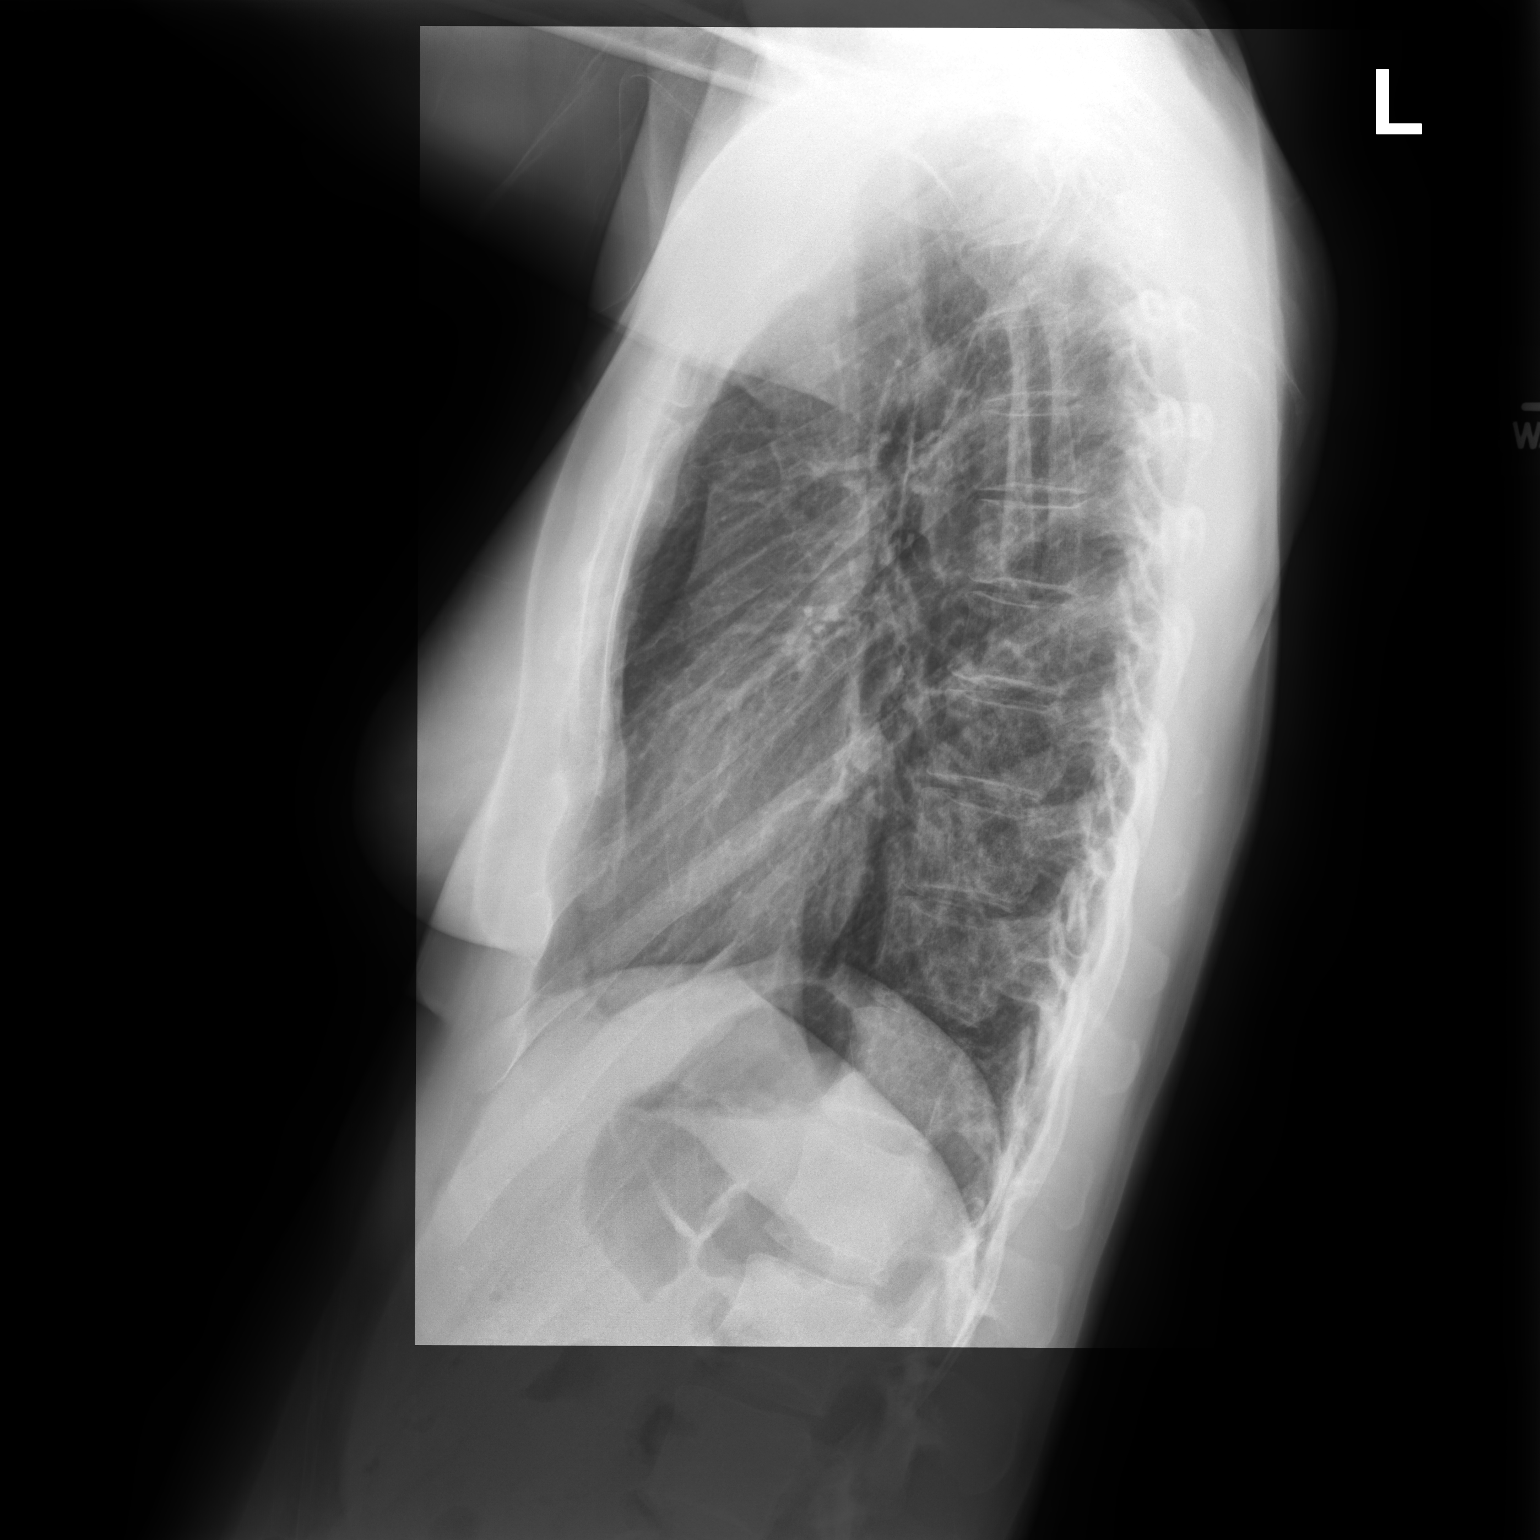

[2 of 2 positions shown; findings below may reference images not displayed]

FINDINGS: The heart size and mediastinal contours are within normal limits.
Both lungs are clear. The visualized skeletal structures are
unremarkable.
IMPRESSION: No active cardiopulmonary disease.

## 2021-12-26 ENCOUNTER — Ambulatory Visit
Admission: EM | Admit: 2021-12-26 | Discharge: 2021-12-26 | Disposition: A | Discharge status: Home / Self Care | Payer: No Typology Code available for payment source | Attending: Family Medicine | Admitting: Family Medicine

## 2021-12-26 DIAGNOSIS — R1030 Lower abdominal pain, unspecified: Secondary | ICD-10-CM | POA: Diagnosis not present

## 2021-12-26 LAB — POCT URINALYSIS DIP (MANUAL ENTRY)
Bilirubin, UA: NEGATIVE
Glucose, UA: NEGATIVE mg/dL
Ketones, POC UA: NEGATIVE mg/dL
Leukocytes, UA: NEGATIVE
Nitrite, UA: NEGATIVE
Protein Ur, POC: NEGATIVE mg/dL
Spec Grav, UA: 1.015 (ref 1.010–1.025)
Urobilinogen, UA: 0.2 E.U./dL
pH, UA: 6 (ref 5.0–8.0)

## 2021-12-26 MED ORDER — KETOROLAC TROMETHAMINE 30 MG/ML IJ SOLN
30.0000 mg | Freq: Once | INTRAMUSCULAR | Status: AC
  Administered 2021-12-26: 30 mg via INTRAMUSCULAR

## 2021-12-26 NOTE — ED Provider Notes (Signed)
[facility]    CSN: [id number] Arrival date & time: 12/26/21  1407      History   Chief Complaint Chief Complaint  Patient presents with   Abdominal Pain   Headache    HPI Chelsea Frederick is a 41 y.o. female.   Presenting today with new onset sharp stabbing generalized abdominal pain, headache that started last night.  She states the pain is slightly less severe today and more in the lower abdomen, headache persists.  Denies recorded fever but did feel chills last night.  Denies associated nausea, vomiting, diarrhea, constipation, rashes, sore throat, cough, congestion, urinary or vaginal symptoms.  No new foods or medication changes.  No recent sick contacts.  Not trying any medications over-the-counter for symptoms.   Past Medical History:  Diagnosis Date   Abnormal EKG 10/16/2019   Anemia 10/06/2019   Cardiac murmur 10/16/2019   DOE (dyspnea on exertion) 10/06/2019   Ear pain, right 02/09/2018   Healthcare maintenance 10/12/2011   Influenza-like illness 09/17/2018   Left knee injury, initial encounter 03/03/2018   Neck fullness 01/16/2018   Formatting of this note might be different from the original. Last Assessment & Plan:  Formatting of this note is different from the original. Complicated by recurrent sweats, low grade temp, achiness, swollen glands only improved with prednisone and NSAIDs. Agree with seeing allergist, continuing antihistamine and nasal spray. ? If other issue- ? Tick borne illness- did pull a lot of ticks off of   Palpitations 10/16/2019   Personal history of COVID-19 10/06/2019    Patient Active Problem List   Diagnosis Date Noted   Palpitations 10/16/2019   Cardiac murmur 10/16/2019   Abnormal EKG 10/16/2019   Personal history of COVID-19 10/06/2019   DOE (dyspnea on exertion) 10/06/2019   Anemia 10/06/2019   Influenza-like illness 09/17/2018   Left knee injury, initial encounter 03/03/2018   Ear pain, right 02/09/2018   Neck fullness  01/16/2018   Healthcare maintenance 10/12/2011    Past Surgical History:  Procedure Laterality Date   ANTERIOR CRUCIATE LIGAMENT REPAIR     TONSILLECTOMY      OB History     Gravida  1   Para  1   Term  1   Preterm      AB      Living  1      SAB      IAB      Ectopic      Multiple      Live Births               Home Medications    Prior to Admission medications   Medication Sig Start Date End Date Taking? Authorizing Provider  Multiple Vitamin (MULTIVITAMIN) tablet Take 1 tablet by mouth daily.    [provider]  naproxen (NAPROSYN) 500 MG tablet Take 1 tablet (500 mg total) by mouth 2 (two) times daily with a meal. 07/06/21   Loyola Mastudd, Stephen M, MD  Probiotic CAPS Take 1 capsule by mouth daily. 03/01/20   [provider]    Family History Family History  Problem Relation Age of Onset   COPD Mother    Heart disease Father 2250       multiple MIs    Social History Social History   Tobacco Use   Smoking status: Never   Smokeless tobacco: Never  Vaping Use   Vaping Use: Never used  Substance Use Topics   Alcohol use: Yes  Comment: occasional   Drug use: No     Allergies   Patient has no known allergies.   Review of Systems Review of Systems PER HPI  Physical Exam Triage Vital Signs ED Triage Vitals  Enc Vitals Group     BP 12/26/21 1458 111/74     Pulse Rate 12/26/21 1458 65     Resp 12/26/21 1458 17     Temp 12/26/21 1458 98.2 F (36.8 C)     Temp Source 12/26/21 1458 Oral     SpO2 12/26/21 1458 93 %     Weight --      Height --      Head Circumference --      Peak Flow --      Pain Score 12/26/21 1457 8     Pain Loc --      Pain Edu? --      Excl. in GC? --    No data found.  Updated Vital Signs BP 111/74 (BP Location: Left Arm)   Pulse 65   Temp 98.2 F (36.8 C) (Oral)   Resp 17   LMP 12/22/2021   SpO2 93%   Visual Acuity Right Eye Distance:   Left Eye Distance:   Bilateral Distance:     Right Eye Near:   Left Eye Near:    Bilateral Near:     Physical Exam Vitals and nursing note reviewed.  Constitutional:      Appearance: Normal appearance. She is not ill-appearing.  HENT:     Head: Atraumatic.     Nose: Nose normal.     Mouth/Throat:     Mouth: Mucous membranes are moist.  Eyes:     Extraocular Movements: Extraocular movements intact.     Conjunctiva/sclera: Conjunctivae normal.  Cardiovascular:     Rate and Rhythm: Normal rate and regular rhythm.     Heart sounds: Normal heart sounds.  Pulmonary:     Effort: Pulmonary effort is normal.     Breath sounds: Normal breath sounds.  Abdominal:     General: Bowel sounds are normal. There is no distension.     Palpations: Abdomen is soft.     Tenderness: There is abdominal tenderness. There is no right CVA tenderness, left CVA tenderness or guarding.     Comments: Bilateral lower abdominal tenderness to palpation without guarding or distention  Musculoskeletal:        General: Normal range of motion.     Cervical back: Normal range of motion and neck supple.  Skin:    General: Skin is warm and dry.  Neurological:     General: No focal deficit present.     Mental Status: She is alert and oriented to person, place, and time.     Motor: No weakness.     Coordination: Coordination normal.     Gait: Gait normal.  Psychiatric:        Mood and Affect: Mood normal.        Thought Content: Thought content normal.        Judgment: Judgment normal.     UC Treatments / Results  Labs (all labs ordered are listed, but only abnormal results are displayed) Labs Reviewed  POCT URINALYSIS DIP (MANUAL ENTRY) - Abnormal; Notable for the following components:      Result Value   Blood, UA trace-intact (*)    All other components within normal limits  CBC WITH DIFFERENTIAL/PLATELET  COMPREHENSIVE METABOLIC PANEL    EKG   Radiology  No results found.  Procedures Procedures (including critical care  time)  Medications Ordered in UC Medications  ketorolac (TORADOL) 30 MG/ML injection 30 mg (30 mg Intramuscular Given 12/26/21 1601)    Initial Impression / Assessment and Plan / UC Course  I have reviewed the triage vital signs and the nursing notes.  Pertinent labs & imaging results that were available during my care of the patient were reviewed by me and considered in my medical decision making (see chart for details).     Unclear etiology of her abdominal pain, exam overall reassuring with no red flag findings.  Vital signs benign within normal limits.  Her urinalysis today showing trace RBCs however patient endorses being on her menstrual cycle.  We will give IM Toradol for pain, discussed brat diet, fluids and close monitoring.  Go to the emergency department if symptoms worsening at any time.  Labs are pending  Final Clinical Impressions(s) / UC Diagnoses   Final diagnoses:  Lower abdominal pain     Discharge Instructions      Go to the emergency department if your pain worsens at any time    ED Prescriptions   None    PDMP not reviewed this encounter.   Particia Nearing, [place] 12/26/21 [phone]

## 2021-12-26 NOTE — ED Triage Notes (Signed)
Pt presents with generalized abdominal pain with headache since last night.

## 2021-12-26 NOTE — Discharge Instructions (Signed)
Go to the emergency department if your pain worsens at any time. 

## 2021-12-27 ENCOUNTER — Ambulatory Visit: Payer: No Typology Code available for payment source

## 2021-12-27 ENCOUNTER — Ambulatory Visit (INDEPENDENT_AMBULATORY_CARE_PROVIDER_SITE_OTHER): Payer: No Typology Code available for payment source | Admitting: Family Medicine

## 2021-12-27 ENCOUNTER — Encounter: Payer: Self-pay | Admitting: Family Medicine

## 2021-12-27 VITALS — BP 100/66 | HR 72 | Temp 97.4°F | Ht 69.0 in | Wt 146.0 lb

## 2021-12-27 DIAGNOSIS — R1030 Lower abdominal pain, unspecified: Secondary | ICD-10-CM

## 2021-12-27 LAB — COMPREHENSIVE METABOLIC PANEL
ALT: 10 IU/L (ref 0–32)
ALT: 10 U/L (ref 0–35)
AST: 16 IU/L (ref 0–40)
AST: 14 U/L (ref 0–37)
Albumin/Globulin Ratio: 1.8 (ref 1.2–2.2)
Albumin: 4.6 g/dL (ref 3.8–4.8)
Albumin: 4.2 g/dL (ref 3.5–5.2)
Alkaline Phosphatase: 56 IU/L (ref 44–121)
Alkaline Phosphatase: 42 U/L (ref 39–117)
BUN/Creatinine Ratio: 14 (ref 9–23)
BUN: 13 mg/dL (ref 6–24)
BUN: 13 mg/dL (ref 6–23)
Bilirubin Total: 0.4 mg/dL (ref 0.0–1.2)
CO2: 24 mmol/L (ref 20–29)
CO2: 29 mEq/L (ref 19–32)
Calcium: 9.5 mg/dL (ref 8.7–10.2)
Calcium: 9.3 mg/dL (ref 8.4–10.5)
Chloride: 103 mmol/L (ref 96–106)
Chloride: 104 mEq/L (ref 96–112)
Creatinine, Ser: 0.92 mg/dL (ref 0.57–1.00)
Creatinine, Ser: 0.87 mg/dL (ref 0.40–1.20)
GFR: 82.13 mL/min (ref >60.00)
Globulin, Total: 2.5 g/dL (ref 1.5–4.5)
Glucose, Bld: 129 mg/dL — ABNORMAL HIGH (ref 70–99)
Glucose: 92 mg/dL (ref 70–99)
Potassium: 3.7 mmol/L (ref 3.5–5.2)
Potassium: 4 mEq/L (ref 3.5–5.1)
Sodium: 139 mmol/L (ref 134–144)
Sodium: 138 mEq/L (ref 135–145)
Total Bilirubin: 0.4 mg/dL (ref 0.2–1.2)
Total Protein: 7.1 g/dL (ref 6.0–8.5)
Total Protein: 6.9 g/dL (ref 6.0–8.3)
eGFR: 80 mL/min/{1.73_m2} (ref >59)

## 2021-12-27 LAB — CBC WITH DIFFERENTIAL/PLATELET
Basophils Absolute: 0.1 10*3/uL (ref 0.0–0.2)
Basos: 1 %
EOS (ABSOLUTE): 0.1 10*3/uL (ref 0.0–0.4)
Eos: 1 %
Hematocrit: 37.4 % (ref 34.0–46.6)
Hemoglobin: 12.6 g/dL (ref 11.1–15.9)
Immature Grans (Abs): 0 10*3/uL (ref 0.0–0.1)
Immature Granulocytes: 0 %
Lymphocytes Absolute: 1.8 10*3/uL (ref 0.7–3.1)
Lymphs: 20 %
MCH: 30.2 pg (ref 26.6–33.0)
MCHC: 33.7 g/dL (ref 31.5–35.7)
MCV: 90 fL (ref 79–97)
Monocytes Absolute: 0.6 10*3/uL (ref 0.1–0.9)
Monocytes: 7 %
Neutrophils Absolute: 6.5 10*3/uL (ref 1.4–7.0)
Neutrophils: 71 %
Platelets: 199 10*3/uL (ref 150–450)
RBC: 4.17 x10E6/uL (ref 3.77–5.28)
RDW: 11.9 % (ref 11.7–15.4)
WBC: 9.2 10*3/uL (ref 3.4–10.8)

## 2021-12-27 LAB — CBC
HCT: 35.9 % — ABNORMAL LOW (ref 36.0–46.0)
Hemoglobin: 12.2 g/dL (ref 12.0–15.0)
MCHC: 34.2 g/dL (ref 30.0–36.0)
MCV: 91.8 fl (ref 78.0–100.0)
Platelets: 193 10*3/uL (ref 150.0–400.0)
RBC: 3.9 Mil/uL (ref 3.87–5.11)
RDW: 12.7 % (ref 11.5–15.5)
WBC: 8.1 10*3/uL (ref 4.0–10.5)

## 2021-12-27 NOTE — Progress Notes (Signed)
Established Patient Office Visit  Subjective   Patient ID: Chelsea Frederick, female    DOB: 28-Sep-1980  Age: 41 y.o. MRN: [id number]  Chief Complaint  Patient presents with  . Follow-up    Pt went to urgent care on 12/26/21 due to severe stomach pain. Pt states the pain has improved with a steroid pain shot. Pt would like to review UA and labs.    HPI for evaluation of a 2-day history of lower abdominal pain.  Seen yesterday in urgent care.  CBC, CMP and urinalysis were normal.  Things seem to improve until this afternoon when a bland diet resulted in its return. It is not as intense but constant.  her cycles are 26 to 28 days.  She is on a 6-day of her usual 7-day menses that is on time.  Her husband has had a vasectomy.  There is no unusual vaginal discharge other than decreasing blood flow.  There is no dysuria urgency or frequency.  Denies constipation but says that stool volumes are less than normal.  This did seem to start with chills and a headache.  These have since passed.  No other family members affected.  No abdominal surgeries.    Review of Systems  Constitutional:  Negative for chills, diaphoresis, fever, malaise/fatigue and weight loss.  HENT: Negative.    Eyes: Negative.  Negative for blurred vision and double vision.  Respiratory:  Negative for cough and shortness of breath.   Cardiovascular:  Negative for chest pain.  Gastrointestinal:  Negative for abdominal pain, blood in stool, constipation, melena, nausea and vomiting.  Genitourinary: Negative.  Negative for dysuria, frequency, hematuria and urgency.  Musculoskeletal:  Negative for falls and myalgias.  Neurological:  Negative for speech change, loss of consciousness and weakness.  Psychiatric/Behavioral: Negative.       Objective:     BP 100/66 (BP Location: Left Arm, Patient Position: Sitting, Cuff Size: Normal)   Pulse 72   Temp (!) 97.4 F (36.3 C) (Temporal)   Ht 5\' 9"  (1.753 m)   Wt 146 lb (66.2  kg)   LMP 12/22/2021   SpO2 97%   BMI 21.56 kg/m    Physical Exam Constitutional:      General: She is not in acute distress.    Appearance: Normal appearance. She is not ill-appearing, toxic-appearing or diaphoretic.  HENT:     Head: Normocephalic and atraumatic.     Right Ear: External ear normal.     Left Ear: External ear normal.     Mouth/Throat:     Mouth: Mucous membranes are moist.     Pharynx: Oropharynx is clear. No oropharyngeal exudate or posterior oropharyngeal erythema.  Eyes:     General: No scleral icterus.       Right eye: No discharge.        Left eye: No discharge.     Extraocular Movements: Extraocular movements intact.     Conjunctiva/sclera: Conjunctivae normal.     Pupils: Pupils are equal, round, and reactive to light.  Cardiovascular:     Rate and Rhythm: Normal rate and regular rhythm.  Pulmonary:     Effort: Pulmonary effort is normal. No respiratory distress.     Breath sounds: Normal breath sounds.  Abdominal:     General: Bowel sounds are normal.     Tenderness: There is abdominal tenderness (ttp of right lower quad). There is guarding (mild guard). There is no rebound.  Musculoskeletal:     Cervical back:  No rigidity or tenderness.  Skin:    General: Skin is warm and dry.  Neurological:     Mental Status: She is alert and oriented to person, place, and time.  Psychiatric:        Mood and Affect: Mood normal.        Behavior: Behavior normal.     No results found for any visits on 12/27/21.    The 10-year ASCVD risk score (Arnett DK, et al., 2019) is: 0.2%    Assessment & Plan:   Problem List Items Addressed This Visit   None Visit Diagnoses     Lower abdominal pain    -  Primary   Relevant Orders   CBC   Urinalysis, Routine w reflex microscopic   Comprehensive metabolic panel   DG Abd 2 Views       No follow-ups on file.  Repeating lab work. Will try colace and fiber. To ER if worse.   Libby Maw, MD

## 2022-01-05 ENCOUNTER — Encounter: Payer: Self-pay | Admitting: Family Medicine

## 2024-01-03 LAB — HM HEPATITIS C SCREENING LAB: HM Hepatitis Screen: NEGATIVE

## 2024-01-03 LAB — HM MAMMOGRAPHY

## 2024-01-03 LAB — HM HIV SCREENING LAB: HM HIV Screening: NEGATIVE

## 2024-07-17 ENCOUNTER — Encounter: Payer: Self-pay | Admitting: Nurse Practitioner

## 2024-07-17 ENCOUNTER — Ambulatory Visit: Admitting: Nurse Practitioner

## 2024-07-17 ENCOUNTER — Ambulatory Visit: Payer: Self-pay | Admitting: Nurse Practitioner

## 2024-07-17 ENCOUNTER — Telehealth: Payer: Self-pay

## 2024-07-17 ENCOUNTER — Ambulatory Visit

## 2024-07-17 VITALS — BP 122/88 | HR 61 | Temp 97.3°F | Ht 69.0 in | Wt 154.8 lb

## 2024-07-17 DIAGNOSIS — R0789 Other chest pain: Secondary | ICD-10-CM

## 2024-07-17 DIAGNOSIS — R052 Subacute cough: Secondary | ICD-10-CM

## 2024-07-17 LAB — COMPREHENSIVE METABOLIC PANEL WITH GFR
ALT: 19 U/L (ref 0–35)
AST: 22 U/L (ref 0–37)
Albumin: 4.4 g/dL (ref 3.5–5.2)
Alkaline Phosphatase: 40 U/L (ref 39–117)
BUN: 14 mg/dL (ref 6–23)
CO2: 27 meq/L (ref 19–32)
Calcium: 9.3 mg/dL (ref 8.4–10.5)
Chloride: 103 meq/L (ref 96–112)
Creatinine, Ser: 0.92 mg/dL (ref 0.40–1.20)
GFR: 75.44 mL/min (ref >60.00)
Glucose, Bld: 85 mg/dL (ref 70–99)
Potassium: 4.2 meq/L (ref 3.5–5.1)
Sodium: 135 meq/L (ref 135–145)
Total Bilirubin: 0.6 mg/dL (ref 0.2–1.2)
Total Protein: 7.1 g/dL (ref 6.0–8.3)

## 2024-07-17 LAB — CBC WITH DIFFERENTIAL/PLATELET
Basophils Absolute: 0 K/uL (ref 0.0–0.1)
Basophils Relative: 0.7 % (ref 0.0–3.0)
Eosinophils Absolute: 0.1 K/uL (ref 0.0–0.7)
Eosinophils Relative: 1.4 % (ref 0.0–5.0)
HCT: 39.4 % (ref 36.0–46.0)
Hemoglobin: 13.3 g/dL (ref 12.0–15.0)
Lymphocytes Relative: 23.4 % (ref 12.0–46.0)
Lymphs Abs: 1.6 K/uL (ref 0.7–4.0)
MCHC: 33.8 g/dL (ref 30.0–36.0)
MCV: 91.4 fl (ref 78.0–100.0)
Monocytes Absolute: 0.4 K/uL (ref 0.1–1.0)
Monocytes Relative: 6.4 % (ref 3.0–12.0)
Neutro Abs: 4.6 K/uL (ref 1.4–7.7)
Neutrophils Relative %: 68.1 % (ref 43.0–77.0)
Platelets: 230 K/uL (ref 150.0–400.0)
RBC: 4.31 Mil/uL (ref 3.87–5.11)
RDW: 13.1 % (ref 11.5–15.5)
WBC: 6.7 K/uL (ref 4.0–10.5)

## 2024-07-17 LAB — D-DIMER, QUANTITATIVE: D-Dimer, Quant: 0.41 ug{FEU}/mL (ref <0.50)

## 2024-07-17 LAB — TSH: TSH: 0.96 u[IU]/mL (ref 0.35–5.50)

## 2024-07-17 MED ORDER — OMEPRAZOLE 40 MG PO CPDR: 40.0000 mg | DELAYED_RELEASE_CAPSULE | Freq: Every day | ORAL | 0 refills | Status: DC

## 2024-07-17 NOTE — Patient Instructions (Addendum)
 It was great to see you!  We are checking your labs today and will let you know the results via mychart/phone.   I have place a referral to cardiology - they will call to schedule   Start omeprazole once a day for 2 weeks in the morning before you eat   Start checking your blood pressure occasionally at home   Try to limit salt in your diet   Let's follow-up with PCP in 4-6 weeks   Take care,  Tinnie Harada, NP

## 2024-07-17 NOTE — Progress Notes (Unsigned)
° °  Acute Office Visit  Subjective:     Patient ID: Chelsea Frederick, female    DOB: 11/18/1980, 43 y.o.   MRN: [id number]  Chief Complaint  Patient presents with   Chest Pressure    Chest pressure in center of chest ongoing for 1 month    HPI Patient is in today for ***  ROS      Objective:    BP 122/88 (BP Location: Left Arm, Patient Position: Sitting, Cuff Size: Normal)   Pulse 61   Temp (!) 97.3 F (36.3 C)   Ht 5' 9 (1.753 m)   Wt 154 lb 12.8 oz (70.2 kg)   LMP 07/15/2024 (Exact Date)   SpO2 99%   BMI 22.86 kg/m  {Vitals History (Optional):23777}  Physical Exam  Results for orders placed or performed in visit on 07/17/24  HM MAMMOGRAPHY  Result Value Ref Range   HM Mammogram 0-4 Bi-Rad 0-4 Bi-Rad, Self Reported Normal  HM HIV SCREENING LAB  Result Value Ref Range   HM HIV Screening Negative - Validated   HM HEPATITIS C SCREENING LAB  Result Value Ref Range   HM Hepatitis Screen Negative-Validated         Assessment & Plan:   Problem List Items Addressed This Visit   None Visit Diagnoses       Chest pressure    -  Primary   Relevant Orders   EKG 12-Lead (Completed)   CBC with Differential/Platelet   Comprehensive metabolic panel with GFR   Lipid panel       No orders of the defined types were placed in this encounter.   No follow-ups on file.  Tinnie DELENA Harada, NP

## 2024-07-17 NOTE — Progress Notes (Signed)
 EKG interpreted by me on 07/17/24 showed normal sinus rhythm with a heart rate of 68. No ST or T wave changes. Similar to prior EKG.

## 2024-07-17 NOTE — Telephone Encounter (Signed)
 Last month had a high BP reading during biometric screening at work, has felt a bit anxious, no chest pain but mild pressure to center of chest that has been lingering for about a month, HA x1 a few days before Thanksgiving but other than that no HA present, no edema present, no blurred vision, no tingling/numbness to extremities, occasional feeling of heart beating fast but nothing to impede normal activities. No dizziness, feels ok right now but just feels like something isn't right, no cough during this phone conversation. 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 LOV with PCP 12/2021.

## 2024-07-18 DIAGNOSIS — R0789 Other chest pain: Secondary | ICD-10-CM | POA: Insufficient documentation

## 2024-07-18 DIAGNOSIS — R030 Elevated blood-pressure reading, without diagnosis of hypertension: Secondary | ICD-10-CM | POA: Insufficient documentation

## 2024-07-18 NOTE — Assessment & Plan Note (Signed)
 Previous elevated reading is now normal with no significant lifestyle changes. She is advised to monitor blood pressure at home or at local pharmacies and to limit salt intake.

## 2024-07-18 NOTE — Assessment & Plan Note (Addendum)
 Intermittent chest pressure with non-specific EKG findings suggests possible cardiac etiology, reflux, or anxiety. EKG showed normal sinus rhythm with a heart rate of 68. No ST or T wave changes. Similar to prior EKG. Occasional non-productive cough is noted. She is referred to cardiology for further evaluation and a chest x-ray is ordered to assess the lungs. Omeprazole 40mg  is started once daily for two weeks to evaluate for reflux. She is advised to seek emergency care if symptoms worsen. Check CMP, CBC, lipid panel, TSH, d-dimer today.

## 2024-08-18 NOTE — Progress Notes (Unsigned)
 "  Cardiology Office Note:   Date:  08/19/2024  ID:  Chelsea Frederick, DOB [birth year], MRN [id number] PCP:  Berneta Elsie Sayre, MD  [facility] Providers Cardiologist:  Wendel Haws, MD Referring MD: Nedra Tinnie LABOR, NP  Chief Complaint/Reason for Referral: Chest pain ASSESSMENT:    1. Precordial pain   2. Palpitations   3. Primary hypertension   4. CKD (chronic kidney disease) stage 2, GFR 60-89 ml/min     PLAN:   In order of problems listed above: Chest pain: Her symptoms are somewhat atypical.  We will obtain a coronary CTA and echocardiogram to evaluate further.  If the patient has mild obstructive coronary artery disease, they will require a statin (with goal LDL < 70) and aspirin, if they have high-grade disease we will need to consider optimal medical therapy and if symptoms are refractory to medical therapy, then a cardiac catheterization with possible PCI will be pursued to alleviate symptoms.  If they have high risk disease we will proceed directly to cardiac catheterization.  She may have esophageal spasm.  I will start Imdur  15 mg at bedtime.  If her coronary CTA is reassuring and Imdur  helps her symptoms then I think a GI evaluation is in order. Palpitations: Will obtain monitor and echocardiogram to evaluate further. Hypertension: Start losartan  25 mg given CKD stage II CKD stage II: Monitor for now            Dispo:  Return in about 6 months (around 02/16/2025).       I spent 41 minutes reviewing all clinical data during and prior to this visit including all relevant imaging studies, laboratories, clinical information from other health systems and prior notes from both Cardiology and other specialties, interviewing the patient, conducting a complete physical examination, and coordinating care in order to formulate a comprehensive and personalized evaluation and treatment plan.   History of Present Illness:    FOCUSED PROBLEM LIST:   Previous cardiac  evaluation for dyspnea 2021 Post COVID infection Normal diastolic function, no significant valvular abnormalities, EF 60 to 65% TTE 2021 CKD stage II GFR 75 December 2025 BMI 28 August 2024:  Patient consents to use of AI scribe. The patient is a 44 year old female with the above listed medical problems here for recommendations regarding chest pressure.  The patient had been seen by her primary care provider recently.  She reported a central chest pressure for the past month.  She also has some occasional palpitations.  A TSH was drawn which was within normal limits.  EKG demonstrated sinus rhythm without acute ischemic changes  She has been experiencing chest pressure and tightening since November 2025. Initially localized to her chest and not constant, the discomfort has progressed to include pressure in her neck and face, occurring daily and intermittently throughout the day. She describes the sensation as 'like someone's squeezing my neck' and notes no association with physical activity or food intake.  In December 2025, she visited her primary care provider due to these symptoms and began monitoring her blood pressure, which has been consistently elevated, often around 140/90 mmHg. She has not started any medication for hypertension. She experiences occasional palpitations, described as a 'harder pump' occurring a couple of times a week, not associated with the chest pressure.  Despite these symptoms, she remains physically active, engaging in strength training and aerobic exercises like Zumba without experiencing symptoms during these activities. She has been on a birth control pill since July 2025, which is  the only recent change in her medication regimen. Her diet has remained consistent, and she maintains a healthy lifestyle with regular exercise.        Current Medications: Active Medications[1]   Review of Systems:   Please see the history of present illness.    All other  systems reviewed and are negative.     EKGs/Labs/Other Test Reviewed:   EKG:   EKG Interpretation Date/Time:  Tuesday August 19 2024 10:58:59 EST Ventricular Rate:  84 PR Interval:  112 QRS Duration:  74 QT Interval:  372 QTC Calculation: 439 R Axis:   102  Text Interpretation: Sinus rhythm with sinus arrhythmia with occasional Premature ventricular complexes Rightward axis Low voltage QRS Cannot rule out Anterior infarct , age undetermined No previous ECGs available Confirmed by Wendel Haws ([id number]) on 08/19/2024 11:01:38 AM        CARDIAC STUDIES: Refer to CV Procedures and Imaging Tabs   Risk Assessment/Calculations:          Physical Exam:   VS:  BP (!) 153/81   Pulse 84   Ht 5' 9 (1.753 m)   Wt 157 lb 1.6 oz (71.3 kg)   SpO2 98%   BMI 23.20 kg/m    HYPERTENSION CONTROL Vitals:   08/19/24 1054 08/19/24 1118  BP: (!) 153/81 (!) 153/81    The patient's blood pressure is elevated above target today.  In order to address the patient's elevated BP: A new medication was prescribed today.      Wt Readings from Last 3 Encounters:  08/19/24 157 lb 1.6 oz (71.3 kg)  07/17/24 154 lb 12.8 oz (70.2 kg)  12/27/21 146 lb (66.2 kg)      GENERAL:  No apparent distress, AOx3 HEENT:  No carotid bruits, +2 carotid impulses, no scleral icterus CAR: RRR no murmurs, gallops, rubs, or thrills RES:  Clear to auscultation bilaterally ABD:  Soft, nontender, nondistended, positive bowel sounds x 4 VASC:  +2 radial pulses, +2 carotid pulses NEURO:  CN 2-12 grossly intact; motor and sensory grossly intact PSYCH:  No active depression or anxiety EXT:  No edema, ecchymosis, or cyanosis  Signed, Aven Christen K Gisell Buehrle, MD  08/19/2024 11:56 AM    [facility] [address] [place], [place], [place]  [address] Phone: [phone]; Fax: [phone]   Note:  This document was prepared using Dragon voice recognition software and may include unintentional dictation  errors.     [1]  Current Meds  Medication Sig   HAILEY 24 FE 1-20 MG-MCG(24) tablet Take 1 tablet by mouth daily.   isosorbide  mononitrate (IMDUR ) 30 MG 24 hr tablet Take 0.5 tablets (15 mg total) by mouth daily.   losartan  (COZAAR ) 25 MG tablet Take 1 tablet (25 mg total) by mouth daily.   metoprolol  tartrate (LOPRESSOR ) 100 MG tablet Take 1 tablet (100 mg total) by mouth once. Take 90-120 minutes prior to scan. Hold for SBP less than 110.   Multiple Vitamin (MULTIVITAMIN) tablet Take 1 tablet by mouth daily.   Probiotic, Lactobacillus, CAPS Take 1 capsule by mouth daily.   "

## 2024-08-19 ENCOUNTER — Encounter: Payer: Self-pay | Admitting: Internal Medicine

## 2024-08-19 ENCOUNTER — Ambulatory Visit

## 2024-08-19 ENCOUNTER — Ambulatory Visit: Attending: Internal Medicine | Admitting: Internal Medicine

## 2024-08-19 VITALS — BP 153/81 | HR 84 | Ht 69.0 in | Wt 157.1 lb

## 2024-08-19 DIAGNOSIS — N182 Chronic kidney disease, stage 2 (mild): Secondary | ICD-10-CM | POA: Diagnosis not present

## 2024-08-19 DIAGNOSIS — R072 Precordial pain: Secondary | ICD-10-CM | POA: Diagnosis not present

## 2024-08-19 DIAGNOSIS — R002 Palpitations: Secondary | ICD-10-CM

## 2024-08-19 DIAGNOSIS — I10 Essential (primary) hypertension: Secondary | ICD-10-CM | POA: Diagnosis not present

## 2024-08-19 MED ORDER — METOPROLOL TARTRATE 100 MG PO TABS: 100.0000 mg | ORAL_TABLET | Freq: Once | ORAL | 0 refills | Status: DC

## 2024-08-19 MED ORDER — ISOSORBIDE MONONITRATE ER 30 MG PO TB24: 15.0000 mg | ORAL_TABLET | Freq: Every day | ORAL | 3 refills | Status: AC

## 2024-08-19 MED ORDER — LOSARTAN POTASSIUM 25 MG PO TABS: 25.0000 mg | ORAL_TABLET | Freq: Every day | ORAL | 3 refills | Status: AC

## 2024-08-19 NOTE — Progress Notes (Unsigned)
 Enrolled patient for a 7 day Zio XT monitor to be mailed to patients home.

## 2024-08-19 NOTE — Patient Instructions (Addendum)
 Medication Instructions:  START Isosorbide  Mononitrate (Imdur ) 15 mg once daily  START Losartan  25 mg once daily  ONCE Metoprolol  Tartrate (Lopressor ) 100 mg two hours prior to coronary CTA scan  *If you need a refill on your cardiac medications before your next appointment, please call your pharmacy*  Lab Work: None ordered today. If you have labs (blood work) drawn today and your tests are completely normal, you will receive your results only by: MyChart Message (if you have MyChart) OR A paper copy in the mail If you have any lab test that is abnormal or we need to change your treatment, we will call you to review the results.  Testing/Procedures: Your physician has requested that you wear a Zio heart monitor for 7 days. This will be mailed to your home with instructions on how to apply the monitor and how to return it when finished. Please allow 2 weeks after returning the heart monitor before our office calls you with the results.   Your physician has requested that you have an echocardiogram. Echocardiography is a painless test that uses sound waves to create images of your heart. It provides your doctor with information about the size and shape of your heart and how well your hearts chambers and valves are working. This procedure takes approximately one hour. There are no restrictions for this procedure. Please do NOT wear cologne, perfume, aftershave, or lotions (deodorant is allowed). Please arrive 15 minutes prior to your appointment time.  Please note: We ask at that you not bring children with you during ultrasound (echo/ vascular) testing. Due to room size and safety concerns, children are not allowed in the ultrasound rooms during exams. Our front office staff cannot provide observation of children in our lobby area while testing is being conducted. An adult accompanying a patient to their appointment will only be allowed in the ultrasound room at the discretion of the  ultrasound technician under special circumstances. We apologize for any inconvenience.   Follow-Up: At [facility], you and your health needs are our priority.  As part of our continuing mission to provide you with exceptional heart care, our providers are all part of one team.  This team includes your primary Cardiologist (physician) and Advanced Practice Providers or APPs (Physician Assistants and Nurse Practitioners) who all work together to provide you with the care you need, when you need it.  Your next appointment:   6 month(s)  Provider:   Lurena Red, MD    We recommend signing up for the patient portal called MyChart.  Sign up information is provided on this After Visit Summary.  MyChart is used to connect with patients for Virtual Visits (Telemedicine).  Patients are able to view lab/test results, encounter notes, upcoming appointments, etc.  Non-urgent messages can be sent to your provider as well.   To learn more about what you can do with MyChart, go to [email or web address].   Other Instructions   Your cardiac CT will be scheduled at one of the below locations:   [facility] [address] [place], [place] [address] [phone]  Please follow these instructions carefully (unless otherwise directed):  An IV will be required for this test and Nitroglycerin will be given.   On the Night Before the Test: Be sure to Drink plenty of water. Do not consume any caffeinated/decaffeinated beverages or chocolate 12 hours prior to your test. Do not take any antihistamines 12 hours prior to your test.  On the Day of the  Test: Drink plenty of water until 1 hour prior to the test. Do not eat any food 1 hour prior to test. You may take your regular medications prior to the test.  Take metoprolol  (Lopressor ) 100 mg two hours prior to test. FEMALES- please wear underwire-free bra if available, avoid dresses & tight clothing       After the Test: Drink  plenty of water. After receiving IV contrast, you may experience a mild flushed feeling. This is normal. On occasion, you may experience a mild rash up to 24 hours after the test. This is not dangerous. If this occurs, you can take Benadryl 25 mg and increase your fluid intake. If you experience trouble breathing, this can be serious. If it is severe call 911 IMMEDIATELY. If it is mild, please call our office.  We will call to schedule your test 2-4 weeks out understanding that some insurance companies will need an authorization prior to the service being performed.   For more information and frequently asked questions, please visit our website : [email or web address]  For non-scheduling related questions, please contact the cardiac imaging nurse navigator should you have any questions/concerns: Cardiac Imaging Nurse Navigators Direct Office Dial: [phone]   For scheduling needs, including cancellations and rescheduling, please call Brittany, [phone].

## 2024-08-21 ENCOUNTER — Ambulatory Visit (HOSPITAL_COMMUNITY)
Admission: RE | Admit: 2024-08-21 | Discharge: 2024-08-21 | Disposition: A | Discharge status: Home / Self Care | Source: Ambulatory Visit | Attending: Surgery | Admitting: Surgery

## 2024-08-21 DIAGNOSIS — R072 Precordial pain: Secondary | ICD-10-CM | POA: Insufficient documentation

## 2024-08-21 LAB — ECHOCARDIOGRAM COMPLETE
AR max vel: 1.53 cm2
AV Area VTI: 1.71 cm2
AV Area mean vel: 1.61 cm2
AV Mean grad: 4 mmHg
AV Peak grad: 8.2 mmHg
Ao pk vel: 1.43 m/s
Area-P 1/2: 3.01 cm2
S' Lateral: 2.49 cm

## 2024-08-22 ENCOUNTER — Ambulatory Visit: Payer: Self-pay | Admitting: Internal Medicine

## 2024-08-28 ENCOUNTER — Ambulatory Visit

## 2024-09-02 ENCOUNTER — Telehealth: Payer: Self-pay | Admitting: Internal Medicine

## 2024-09-02 ENCOUNTER — Encounter: Payer: Self-pay | Admitting: Internal Medicine

## 2024-09-02 ENCOUNTER — Other Ambulatory Visit: Payer: Self-pay | Admitting: Medical Genetics

## 2024-09-02 NOTE — Telephone Encounter (Signed)
 Patient wants to get a referral to have her CT test scheduled at [facility] due to her insurance.  Patient noted message can be sent through MyChart.

## 2024-09-04 ENCOUNTER — Ambulatory Visit (INDEPENDENT_AMBULATORY_CARE_PROVIDER_SITE_OTHER): Admitting: Family Medicine

## 2024-09-04 ENCOUNTER — Encounter (HOSPITAL_COMMUNITY): Payer: Self-pay

## 2024-09-04 ENCOUNTER — Encounter: Payer: Self-pay | Admitting: Family Medicine

## 2024-09-04 ENCOUNTER — Ambulatory Visit (HOSPITAL_COMMUNITY)

## 2024-09-04 ENCOUNTER — Other Ambulatory Visit

## 2024-09-04 VITALS — BP 110/70 | HR 80 | Temp 98.3°F | Ht 69.0 in | Wt 155.6 lb

## 2024-09-04 DIAGNOSIS — Z006 Encounter for examination for normal comparison and control in clinical research program: Secondary | ICD-10-CM | POA: Diagnosis not present

## 2024-09-04 DIAGNOSIS — Z131 Encounter for screening for diabetes mellitus: Secondary | ICD-10-CM

## 2024-09-04 DIAGNOSIS — Z13220 Encounter for screening for lipoid disorders: Secondary | ICD-10-CM | POA: Diagnosis not present

## 2024-09-04 DIAGNOSIS — I10 Essential (primary) hypertension: Secondary | ICD-10-CM | POA: Diagnosis not present

## 2024-09-04 DIAGNOSIS — Z0000 Encounter for general adult medical examination without abnormal findings: Secondary | ICD-10-CM

## 2024-09-04 LAB — GENECONNECT MOLECULAR SCREEN

## 2024-09-04 NOTE — Progress Notes (Signed)
 "  Established Patient Office Visit   Subjective:  Patient ID: Chelsea Frederick, female    DOB: 01-22-1981  Age: 44 y.o. MRN: [id number]  Chief Complaint  Patient presents with   Medical Management of Chronic Issues    Four week follow-up     HPI Encounter Diagnoses  Name Primary?   Healthcare maintenance Yes   Research study patient    Screening for diabetes mellitus    Screening for cholesterol level    Essential hypertension    Here for health maintenance check.  Doing well.  She is exercising 5 days weekly and has regular dental and GYN care.  Seeing cardiology for treatment of hypertension.  Cardiology concerned about lower GFR.   Review of Systems  Constitutional: Negative.   HENT: Negative.    Eyes:  Negative for blurred vision, discharge and redness.  Respiratory: Negative.    Cardiovascular: Negative.   Gastrointestinal:  Negative for abdominal pain.  Genitourinary: Negative.   Musculoskeletal: Negative.  Negative for myalgias.  Skin:  Negative for rash.  Neurological:  Negative for tingling, loss of consciousness and weakness.  Endo/Heme/Allergies:  Negative for polydipsia.    Current Medications[1]   Objective:     BP 110/70   Pulse 80   Temp 98.3 F (36.8 C)   Ht 5' 9 (1.753 m)   Wt 155 lb 9.6 oz (70.6 kg)   SpO2 99%   BMI 22.98 kg/m    Physical Exam Constitutional:      General: She is not in acute distress.    Appearance: Normal appearance. She is not ill-appearing, toxic-appearing or diaphoretic.  HENT:     Head: Normocephalic and atraumatic.     Right Ear: Tympanic membrane, ear canal and external ear normal.     Left Ear: Tympanic membrane, ear canal and external ear normal.     Mouth/Throat:     Mouth: Mucous membranes are moist.     Pharynx: Oropharynx is clear. No oropharyngeal exudate or posterior oropharyngeal erythema.  Eyes:     General: No scleral icterus.       Right eye: No discharge.        Left eye: No discharge.      Extraocular Movements: Extraocular movements intact.     Conjunctiva/sclera: Conjunctivae normal.     Pupils: Pupils are equal, round, and reactive to light.  Cardiovascular:     Rate and Rhythm: Normal rate and regular rhythm.  Pulmonary:     Effort: Pulmonary effort is normal. No respiratory distress.     Breath sounds: Normal breath sounds. No wheezing or rales.  Abdominal:     General: Bowel sounds are normal.  Musculoskeletal:     Cervical back: No rigidity or tenderness.  Skin:    General: Skin is warm and dry.  Neurological:     Mental Status: She is alert and oriented to person, place, and time.  Psychiatric:        Mood and Affect: Mood normal.        Behavior: Behavior normal.      No results found for any visits on 09/04/24.    The ASCVD Risk score (Arnett DK, et al., 2019) failed to calculate for the following reasons:   Cannot find a previous HDL lab   Cannot find a previous total cholesterol lab   * - Cholesterol units were assumed    Assessment & Plan:   Healthcare maintenance -     Urinalysis, Routine w reflex  microscopic; Future  Research study patient -     Transport Planner Screen; Future  Screening for diabetes mellitus -     Hemoglobin A1c; Future  Screening for cholesterol level -     Lipid panel; Future  Essential hypertension -     Microalbumin / creatinine urine ratio; Future    Return in about 1 year (around 09/04/2025), or Return fasting for blood work please..  Continue healthy active lifestyle.  Information given on health maintenance and disease prevention.  Elsie Sim Lent, MD    [1]  Current Outpatient Medications:    HAILEY 24 FE 1-20 MG-MCG(24) tablet, Take 1 tablet by mouth daily., Disp: , Rfl:    isosorbide  mononitrate (IMDUR ) 30 MG 24 hr tablet, Take 0.5 tablets (15 mg total) by mouth daily., Disp: 30 tablet, Rfl: 3   losartan  (COZAAR ) 25 MG tablet, Take 1 tablet (25 mg total) by mouth daily., Disp: 30  tablet, Rfl: 3   Multiple Vitamin (MULTIVITAMIN) tablet, Take 1 tablet by mouth daily., Disp: , Rfl:    Probiotic, Lactobacillus, CAPS, Take 1 capsule by mouth daily., Disp: , Rfl:   "

## 2024-09-09 DIAGNOSIS — R002 Palpitations: Secondary | ICD-10-CM | POA: Diagnosis not present

## 2024-09-11 ENCOUNTER — Other Ambulatory Visit

## 2024-09-11 DIAGNOSIS — Z0000 Encounter for general adult medical examination without abnormal findings: Secondary | ICD-10-CM

## 2024-09-11 DIAGNOSIS — I10 Essential (primary) hypertension: Secondary | ICD-10-CM

## 2024-09-11 DIAGNOSIS — Z13220 Encounter for screening for lipoid disorders: Secondary | ICD-10-CM

## 2024-09-11 DIAGNOSIS — Z131 Encounter for screening for diabetes mellitus: Secondary | ICD-10-CM

## 2024-09-11 LAB — URINALYSIS, ROUTINE W REFLEX MICROSCOPIC
Bilirubin Urine: NEGATIVE
Ketones, ur: NEGATIVE
Leukocytes,Ua: NEGATIVE
Nitrite: NEGATIVE
Specific Gravity, Urine: 1.01 (ref 1.000–1.030)
Total Protein, Urine: NEGATIVE
Urine Glucose: NEGATIVE
Urobilinogen, UA: 0.2 (ref 0.0–1.0)
pH: 6 (ref 5.0–8.0)

## 2024-09-11 LAB — LIPID PANEL
Cholesterol: 201 mg/dL — ABNORMAL HIGH (ref 28–200)
HDL: 62.4 mg/dL
LDL Cholesterol: 110 mg/dL — ABNORMAL HIGH (ref 10–99)
NonHDL: 138.56
Total CHOL/HDL Ratio: 3
Triglycerides: 142 mg/dL (ref 10.0–149.0)
VLDL: 28.4 mg/dL (ref 0.0–40.0)

## 2024-09-11 LAB — MICROALBUMIN / CREATININE URINE RATIO
Creatinine,U: 72.1 mg/dL
Microalb Creat Ratio: UNDETERMINED mg/g (ref 0.0–30.0)
Microalb, Ur: <0.7 mg/dL

## 2024-09-11 LAB — HEMOGLOBIN A1C: Hgb A1c MFr Bld: 5.5 % (ref 4.6–6.5)

## 2024-09-12 ENCOUNTER — Ambulatory Visit: Payer: Self-pay | Admitting: Family Medicine

## 2025-09-11 ENCOUNTER — Encounter: Admitting: Family Medicine
